# Patient Record
Sex: Female | Born: 1955 | Race: Black or African American | Hispanic: No | Marital: Married | Smoking: Never smoker
Health system: Southern US, Community
[De-identification: ages and names within clinical notes are randomized; demographics above are authoritative.]

## PROBLEM LIST (undated history)

## (undated) DIAGNOSIS — Z87442 Personal history of urinary calculi: Secondary | ICD-10-CM

## (undated) DIAGNOSIS — H00029 Hordeolum internum unspecified eye, unspecified eyelid: Secondary | ICD-10-CM

## (undated) DIAGNOSIS — B029 Zoster without complications: Secondary | ICD-10-CM

## (undated) DIAGNOSIS — M5126 Other intervertebral disc displacement, lumbar region: Secondary | ICD-10-CM

## (undated) DIAGNOSIS — I10 Essential (primary) hypertension: Secondary | ICD-10-CM

## (undated) DIAGNOSIS — R200 Anesthesia of skin: Secondary | ICD-10-CM

## (undated) DIAGNOSIS — Z973 Presence of spectacles and contact lenses: Secondary | ICD-10-CM

## (undated) DIAGNOSIS — R51 Headache: Secondary | ICD-10-CM

## (undated) DIAGNOSIS — T7840XA Allergy, unspecified, initial encounter: Secondary | ICD-10-CM

## (undated) DIAGNOSIS — K432 Incisional hernia without obstruction or gangrene: Secondary | ICD-10-CM

## (undated) HISTORY — PX: OTHER SURGICAL HISTORY: SHX169

## (undated) HISTORY — PX: CYSTOSCOPY/URETEROSCOPY/HOLMIUM LASER/STENT PLACEMENT: SHX6546

## (undated) HISTORY — PX: LITHOTRIPSY: SUR834

## (undated) HISTORY — PX: ABDOMINAL HYSTERECTOMY: SHX81

## (undated) HISTORY — DX: Allergy, unspecified, initial encounter: T78.40XA

---

## 1999-04-20 ENCOUNTER — Other Ambulatory Visit: Admission: RE | Admit: 1999-04-20 | Discharge: 1999-04-20 | Payer: Self-pay | Admitting: Obstetrics and Gynecology

## 2000-04-22 ENCOUNTER — Encounter: Payer: Self-pay | Admitting: Obstetrics and Gynecology

## 2000-04-22 ENCOUNTER — Encounter: Admission: RE | Admit: 2000-04-22 | Discharge: 2000-04-22 | Payer: Self-pay | Admitting: Obstetrics and Gynecology

## 2002-03-08 ENCOUNTER — Encounter: Payer: Self-pay | Admitting: Obstetrics and Gynecology

## 2002-03-08 ENCOUNTER — Encounter: Admission: RE | Admit: 2002-03-08 | Discharge: 2002-03-08 | Payer: Self-pay | Admitting: Obstetrics and Gynecology

## 2003-05-16 ENCOUNTER — Encounter: Admission: RE | Admit: 2003-05-16 | Discharge: 2003-05-16 | Payer: Self-pay | Admitting: Obstetrics and Gynecology

## 2003-05-16 ENCOUNTER — Encounter: Payer: Self-pay | Admitting: Obstetrics and Gynecology

## 2004-05-14 ENCOUNTER — Other Ambulatory Visit: Admission: RE | Admit: 2004-05-14 | Discharge: 2004-05-14 | Payer: Self-pay | Admitting: Gynecology

## 2004-09-04 ENCOUNTER — Encounter: Admission: RE | Admit: 2004-09-04 | Discharge: 2004-09-04 | Payer: Self-pay | Admitting: Gynecology

## 2005-07-22 ENCOUNTER — Other Ambulatory Visit: Admission: RE | Admit: 2005-07-22 | Discharge: 2005-07-22 | Payer: Self-pay | Admitting: Gynecology

## 2005-12-27 ENCOUNTER — Encounter: Admission: RE | Admit: 2005-12-27 | Discharge: 2005-12-27 | Payer: Self-pay | Admitting: Gynecology

## 2006-05-23 ENCOUNTER — Ambulatory Visit: Payer: Self-pay | Admitting: Gastroenterology

## 2006-05-30 ENCOUNTER — Ambulatory Visit (HOSPITAL_BASED_OUTPATIENT_CLINIC_OR_DEPARTMENT_OTHER): Admission: RE | Admit: 2006-05-30 | Discharge: 2006-05-30 | Payer: Self-pay | Admitting: Orthopedic Surgery

## 2006-06-02 ENCOUNTER — Ambulatory Visit: Payer: Self-pay | Admitting: Gastroenterology

## 2006-11-01 HISTORY — PX: OTHER SURGICAL HISTORY: SHX169

## 2006-12-14 ENCOUNTER — Other Ambulatory Visit: Admission: RE | Admit: 2006-12-14 | Discharge: 2006-12-14 | Payer: Self-pay | Admitting: Gynecology

## 2007-01-26 ENCOUNTER — Encounter: Admission: RE | Admit: 2007-01-26 | Discharge: 2007-01-26 | Payer: Self-pay | Admitting: Gynecology

## 2008-02-02 ENCOUNTER — Encounter: Admission: RE | Admit: 2008-02-02 | Discharge: 2008-02-02 | Payer: Self-pay | Admitting: Gynecology

## 2008-11-25 ENCOUNTER — Encounter: Admission: RE | Admit: 2008-11-25 | Discharge: 2009-02-23 | Payer: Self-pay | Admitting: Gynecology

## 2009-02-03 ENCOUNTER — Encounter: Admission: RE | Admit: 2009-02-03 | Discharge: 2009-02-03 | Payer: Self-pay | Admitting: Gynecology

## 2009-07-31 ENCOUNTER — Encounter: Admission: RE | Admit: 2009-07-31 | Discharge: 2009-07-31 | Payer: Self-pay | Admitting: Family Medicine

## 2009-08-01 ENCOUNTER — Encounter: Admission: RE | Admit: 2009-08-01 | Discharge: 2009-08-01 | Payer: Self-pay | Admitting: Family Medicine

## 2009-08-11 ENCOUNTER — Ambulatory Visit (HOSPITAL_BASED_OUTPATIENT_CLINIC_OR_DEPARTMENT_OTHER): Admission: RE | Admit: 2009-08-11 | Discharge: 2009-08-11 | Payer: Self-pay | Admitting: Urology

## 2010-02-23 ENCOUNTER — Encounter: Admission: RE | Admit: 2010-02-23 | Discharge: 2010-02-23 | Payer: Self-pay | Admitting: Gynecology

## 2010-09-21 ENCOUNTER — Ambulatory Visit (HOSPITAL_COMMUNITY): Admission: RE | Admit: 2010-09-21 | Discharge: 2010-09-21 | Payer: Self-pay | Admitting: Urology

## 2010-09-22 ENCOUNTER — Ambulatory Visit (HOSPITAL_COMMUNITY)
Admission: RE | Admit: 2010-09-22 | Discharge: 2010-09-22 | Payer: Self-pay | Source: Home / Self Care | Admitting: Surgery

## 2010-10-06 ENCOUNTER — Ambulatory Visit (HOSPITAL_COMMUNITY)
Admission: RE | Admit: 2010-10-06 | Discharge: 2010-10-06 | Payer: Self-pay | Source: Home / Self Care | Admitting: Surgery

## 2010-11-01 HISTORY — PX: LITHOTRIPSY: SUR834

## 2010-12-17 ENCOUNTER — Ambulatory Visit (HOSPITAL_COMMUNITY)
Admission: RE | Admit: 2010-12-17 | Discharge: 2010-12-17 | Disposition: A | Discharge status: Home / Self Care | Payer: 59 | Source: Ambulatory Visit | Attending: Urology | Admitting: Urology

## 2010-12-17 ENCOUNTER — Ambulatory Visit (HOSPITAL_COMMUNITY)
Admission: RE | Admit: 2010-12-17 | Discharge: 2010-12-17 | Disposition: A | Discharge status: Home / Self Care | Payer: 59 | Attending: Urology | Admitting: Urology

## 2010-12-17 DIAGNOSIS — N200 Calculus of kidney: Secondary | ICD-10-CM | POA: Insufficient documentation

## 2010-12-17 DIAGNOSIS — R32 Unspecified urinary incontinence: Secondary | ICD-10-CM | POA: Insufficient documentation

## 2010-12-17 DIAGNOSIS — Z9079 Acquired absence of other genital organ(s): Secondary | ICD-10-CM | POA: Insufficient documentation

## 2010-12-17 DIAGNOSIS — Z79890 Hormone replacement therapy (postmenopausal): Secondary | ICD-10-CM | POA: Insufficient documentation

## 2010-12-17 DIAGNOSIS — Z880 Allergy status to penicillin: Secondary | ICD-10-CM | POA: Insufficient documentation

## 2010-12-17 DIAGNOSIS — N201 Calculus of ureter: Secondary | ICD-10-CM | POA: Insufficient documentation

## 2010-12-17 DIAGNOSIS — N281 Cyst of kidney, acquired: Secondary | ICD-10-CM | POA: Insufficient documentation

## 2010-12-17 DIAGNOSIS — Z79899 Other long term (current) drug therapy: Secondary | ICD-10-CM | POA: Insufficient documentation

## 2011-01-12 LAB — CBC
HCT: 39.5 % (ref 36.0–46.0)
MCH: 27.2 pg (ref 26.0–34.0)
MCHC: 32.7 g/dL (ref 30.0–36.0)
MCV: 83.2 fL (ref 78.0–100.0)
RBC: 4.75 MIL/uL (ref 3.87–5.11)
WBC: 4.4 10*3/uL (ref 4.0–10.5)

## 2011-01-12 LAB — URINALYSIS, ROUTINE W REFLEX MICROSCOPIC
Nitrite: NEGATIVE
Specific Gravity, Urine: 1.02 (ref 1.005–1.030)
pH: 7 (ref 5.0–8.0)

## 2011-01-12 LAB — BASIC METABOLIC PANEL
BUN: 10 mg/dL (ref 6–23)
Calcium: 10.8 mg/dL — ABNORMAL HIGH (ref 8.4–10.5)
Potassium: 3.8 mEq/L (ref 3.5–5.1)
Sodium: 138 mEq/L (ref 135–145)

## 2011-01-12 LAB — URINE MICROSCOPIC-ADD ON

## 2011-01-12 LAB — DIFFERENTIAL
Basophils Absolute: 0 10*3/uL (ref 0.0–0.1)
Eosinophils Absolute: 0.2 10*3/uL (ref 0.0–0.7)
Eosinophils Relative: 4 % (ref 0–5)
Lymphocytes Relative: 42 % (ref 12–46)
Monocytes Absolute: 0.3 10*3/uL (ref 0.1–1.0)

## 2011-01-12 LAB — SURGICAL PCR SCREEN: MRSA, PCR: NEGATIVE

## 2011-01-12 LAB — PROTIME-INR: INR: 0.94 (ref 0.00–1.49)

## 2011-01-25 ENCOUNTER — Other Ambulatory Visit: Payer: Self-pay | Admitting: Gynecology

## 2011-01-25 DIAGNOSIS — Z1231 Encounter for screening mammogram for malignant neoplasm of breast: Secondary | ICD-10-CM

## 2011-03-15 ENCOUNTER — Ambulatory Visit
Admission: RE | Admit: 2011-03-15 | Discharge: 2011-03-15 | Disposition: A | Discharge status: Home / Self Care | Payer: 59 | Source: Ambulatory Visit | Attending: Gynecology | Admitting: Gynecology

## 2011-03-15 DIAGNOSIS — Z1231 Encounter for screening mammogram for malignant neoplasm of breast: Secondary | ICD-10-CM

## 2011-03-19 NOTE — Op Note (Signed)
[name], Janice Guerra                ACCOUNT NO.:  [id number]   MEDICAL RECORD NO.:  [id number]          PATIENT TYPE:  AMB   LOCATION:  DSC                          FACILITY:  [facility]   PHYSICIAN:  Feliberto Gottron. Turner Daniels, M.D.   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   PROCEDURE:  Right hand trigger thumb release and right de Quervain's  stenosing tenosynovitis release.   SURGEON:  Feliberto Gottron. Turner Daniels, M.D.   FIRST ASSISTANT:  Skip Mayer, PA-C   ANESTHETIC:  General LMA.   ESTIMATED BLOOD LOSS:  Minimal.   TOURNIQUET TIME:  30 minutes.   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  She desires elective trigger thumb release and de  Quervain's release to decrease pain and decrease function.  Risks and  benefits of surgery were discussed preoperatively and she was prepared for  surgical intervention.   DESCRIPTION OF PROCEDURE:  The patient identified by arm band, taken to the  operating room at [facility] where the appropriate monitors  were attached and general LMA anesthesia induced with the patient in the  supine position.  Tourniquet was applied to the right forearm and the right  hand prepped and draped in the usual sterile fashion from the fingertips to  the tourniquet, the limb wrapped with an Esmarch bandage, inflated to 250  mmHg.  We began the procedure by making a 1.5-cm longitudinal incision over  the abductor pollicus longus and extensor pollicus brevis as it went  through  the A1 pulley which was quite enlarged.  Small bleeders were identified and  cauterized.  We immediately encountered the pulley.  We were careful to  spare any significant branches of the superficial radial nerve and a  rectangular section of the pulley was removed and all four the tendons and  tendon sheaths were identified and we made sure their release was thorough.  The wound was irrigated out with normal saline solution.  We then directed  our attention to the thumb and made a transverse incision over the A1  pulley, about 1 cm to 1.5 cm in length, identified both neurovascular  bundles which were protected, and then likewise removed a rectangular  section of the A1 pulley which pretty much eliminated all the triggering.  That wound was also irrigated out with normal saline solution and then both  wounds were closed with running 4-0 nylon  suture.  A dressing of Xeroform, 4 x 4 dressing, sponge, Webril, and an Ace  wrap applied after first infiltrating the skin with 0.5% Marcaine solution  without epinephrine.  The patient was then awakened and taken to the  recovery room without  difficulty.      Feliberto Gottron. Turner Daniels, M.D.  Electronically Signed     FJR/MEDQ  D:  05/30/2006  T:  05/30/2006  Job:  [id number]

## 2011-03-31 ENCOUNTER — Other Ambulatory Visit: Payer: Self-pay | Admitting: Gynecology

## 2011-06-21 ENCOUNTER — Encounter: Payer: Self-pay | Admitting: Gastroenterology

## 2011-09-10 ENCOUNTER — Other Ambulatory Visit: Payer: Self-pay | Admitting: Urology

## 2011-09-15 ENCOUNTER — Encounter (HOSPITAL_COMMUNITY): Payer: Self-pay | Admitting: *Deleted

## 2011-09-15 NOTE — H&P (Signed)
Active Problems Problems  1. Diffuse Abdominal Pain 789.07 right flank pain 2. Gross Hematuria 599.71 3. Nephrolithiasis Bilateral 592.0 4. Proximal Ureteral Stone On The Right 592.1 5. Pyuria 791.9 6. Renal Cyst 593.2 7. Urinary Incontinence 788.30  History of Present Illness        Janice Guerra was in about 2 weeks ago because of hematuria.  She had a single episode of right abdominal pain.  A CTU showed a 6mm right ureteral stone and bilateral renal stones.   She has not seen the stone pass but still has bleeding.   Past Medical History Problems  1. History of  Hydronephrosis Right 591 2. History of  Hyperparathyroidism 252.00 3. History of  Nephrolithiasis V13.01 4. History of  Ureteral Stone Right 592.1  Surgical History Problems  1. History of  Cystoscopy With Insertion Of Ureteral Stent Right 2. History of  Cystoscopy With Ureteroscopy Right 3. History of  Hysterectomy V45.77 4. History of  Lithotripsy 5. History of  Parathyroid Surgery  Current Meds 1. Lotemax 0.5 % Ophthalmic Suspension; Therapy: 14Oct2011 to 2. Oxycodone-Acetaminophen 5-325 MG Oral Tablet; TAKE 1 TO 2 TABLETS EVERY 4 TO 6 HOURS  AS NEEDED FOR PAIN; Therapy: 23Oct2012 to (Evaluate:26Oct2012); Last Rx:23Oct2012 3. Restasis 0.05 % Ophthalmic Emulsion; Therapy: 29Oct2011 to 4. Tamsulosin HCl 0.4 MG Oral Capsule; TAKE ONE CAPSULE BY MOUTH EVERY DAY WITH  MEALS; Therapy: 17Feb2012 to (Evaluate:17Apr2012); Last Rx:17Feb2012 5. Vivelle-Dot 0.05 MG/24HR Transdermal Patch Biweekly; Therapy: 31Jul2011 to  Allergies Medication  1. Penicillins  Family History Problems  1. Paternal history of  Heart Disease V17.49 2. Sororal history of  Nephrolithiasis 3. Paternal history of  Nephrolithiasis 4. Maternal history of  Nephrolithiasis  Social History Problems  1. Caffeine Use 1-2 cups 2. Former Smoker 3. Marital History - Currently Married 4. Occupation: HR manager  Review of Systems  Genitourinary:  hematuria.  Gastrointestinal: no nausea and no abdominal pain.  Constitutional: no fever.    Vitals Vital Signs [Data Includes: Last 1 Day]  07Nov2012 03:57PM  Blood Pressure: 132 / 83 Temperature: 98.6 F Heart Rate: 75  Physical Exam Constitutional: Well nourished and well developed . No acute distress.  Pulmonary: No respiratory distress and normal respiratory rhythm and effort.  Cardiovascular: Heart rate and rhythm are normal . No peripheral edema.    Results/Data Urine [Data Includes: Last 1 Day]  07Nov2012  COLOR: YELLOW  Reference Range YELLOW APPEARANCE: CLEAR  Reference Range CLEAR SPECIFIC GRAVITY: <1.005  Abnormal Low Reference Range 1.005-1.030 pH: 6.5  Reference Range 5.0-8.0 GLUCOSE: NEG mg/dL Reference Range NEG BILIRUBIN: NEG  Reference Range NEG KETONE: NEG mg/dL Reference Range NEG BLOOD: LARGE  Abnormal Reference Range NEG PROTEIN: NEG mg/dL Reference Range NEG UROBILINOGEN: 0.2 mg/dL Reference Range 4.0-9.8 NITRITE: NEG  Reference Range NEG LEUKOCYTE ESTERASE: NEG  Reference Range NEG SQUAMOUS EPITHELIAL/HPF: RARE  Reference Range RARE WBC: 0-3 WBC/hpf Reference Range <4 RBC: 11-20 RBC/hpf Abnormal Reference Range <4 BACTERIA: RARE  Reference Range RARE CRYSTALS: NONE SEEN  Reference Range NEG CASTS: NONE SEEN  Reference Range NEG  The following images/tracing/specimen were independently visualized:  KUB today shows a stable 5-2mm LLP stone and small right lower pole stones with a 6 mm stone at the right UPJ. there is a pill shadow over the RLP. The study is otherwise unremarkable.    Assessment Assessed  1. Proximal Ureteral Stone On The Right 592.1 2. Nephrolithiasis Bilateral 592.0   Her right proximal stone has not moved.  She has stable  bilateral lower pole stones.   Plan Health Maintenance (V70.0)  1. UA With REFLEX  Done: 07Nov2012 03:39PM Proximal Ureteral Stone On The Right (592.1)  2. KUB  Done: 07Nov2012 12:00AM   I discussed the  options for treatment including ureteroscopy and ESWL.  She is aware of the risks based on her prior lithotripsies.   I will try to get her set up in the next 1-3 weeks.   Discussion/Summary  CC: Dr. Darnell Level.

## 2011-09-15 NOTE — Pre-Procedure Instructions (Signed)
Pt instructed to take laxative this afternoon and eat a light supper and. Call [facility] to get blue folder

## 2011-09-16 ENCOUNTER — Ambulatory Visit (HOSPITAL_COMMUNITY): Payer: 59

## 2011-09-16 ENCOUNTER — Encounter (HOSPITAL_COMMUNITY)
Admission: RE | Disposition: A | Discharge status: Home / Self Care | Payer: Self-pay | Source: Ambulatory Visit | Attending: Urology

## 2011-09-16 ENCOUNTER — Ambulatory Visit (HOSPITAL_COMMUNITY)
Admission: RE | Admit: 2011-09-16 | Discharge: 2011-09-16 | Disposition: A | Discharge status: Home / Self Care | Payer: 59 | Source: Ambulatory Visit | Attending: Urology | Admitting: Urology

## 2011-09-16 ENCOUNTER — Encounter (HOSPITAL_COMMUNITY): Payer: Self-pay | Admitting: *Deleted

## 2011-09-16 DIAGNOSIS — N200 Calculus of kidney: Secondary | ICD-10-CM | POA: Insufficient documentation

## 2011-09-16 DIAGNOSIS — N201 Calculus of ureter: Secondary | ICD-10-CM | POA: Diagnosis present

## 2011-09-16 HISTORY — DX: Headache: R51

## 2011-09-16 SURGERY — LITHOTRIPSY, ESWL
Anesthesia: LOCAL | Laterality: Right

## 2011-09-16 MED ORDER — DIAZEPAM 5 MG PO TABS
ORAL_TABLET | ORAL | Status: AC
  Filled 2011-09-16: qty 2

## 2011-09-16 MED ORDER — CIPROFLOXACIN IN D5W 400 MG/200ML IV SOLN
400.0000 mg | Freq: Once | INTRAVENOUS | Status: AC
  Administered 2011-09-16: 400 mg via INTRAVENOUS
  Filled 2011-09-16: qty 200

## 2011-09-16 MED ORDER — DEXTROSE-NACL 5-0.45 % IV SOLN
Freq: Once | INTRAVENOUS | Status: AC
  Administered 2011-09-16: 16:00:00 via INTRAVENOUS

## 2011-09-16 MED ORDER — DIPHENHYDRAMINE HCL 25 MG PO CAPS
ORAL_CAPSULE | ORAL | Status: AC
  Filled 2011-09-16: qty 1

## 2011-09-16 MED ORDER — ONDANSETRON HCL 4 MG/2ML IJ SOLN
4.0000 mg | Freq: Once | INTRAMUSCULAR | Status: AC
  Administered 2011-09-16: 4 mg via INTRAVENOUS

## 2011-09-16 MED ORDER — CIPROFLOXACIN IN D5W 200 MG/100ML IV SOLN: 200.0000 mg | Freq: Once | INTRAVENOUS | Status: DC

## 2011-09-16 MED ORDER — DIPHENHYDRAMINE HCL 25 MG PO CAPS
25.0000 mg | ORAL_CAPSULE | ORAL | Status: AC
  Administered 2011-09-16: 25 mg via ORAL

## 2011-09-16 MED ORDER — ONDANSETRON HCL 4 MG/2ML IJ SOLN
INTRAMUSCULAR | Status: AC
  Filled 2011-09-16: qty 2

## 2011-09-16 MED ORDER — DIAZEPAM 5 MG PO TABS
10.0000 mg | ORAL_TABLET | ORAL | Status: AC
  Administered 2011-09-16: 10 mg via ORAL

## 2011-09-16 NOTE — Interval H&P Note (Signed)
History and Physical Interval Note:   09/16/2011   7:34 AM   Janice Guerra  has presented today for surgery, with the diagnosis of Right Proximal Stone  The various methods of treatment have been discussed with the patient and family. After consideration of risks, benefits and other options for treatment, the patient has consented to  Procedure(s): EXTRACORPOREAL SHOCK WAVE LITHOTRIPSY (ESWL) as a surgical intervention .  The patients' history has been reviewed, patient examined, no change in status, stable for surgery.  I have reviewed the patients' chart and labs.  Questions were answered to the patient's satisfaction.     Anner Crete  MD

## 2011-09-16 NOTE — Brief Op Note (Signed)
09/16/2011  5:25 PM  PATIENT:  Janice Guerra  55 y.o. female  PRE-OPERATIVE DIAGNOSIS:  Right Proximal Stone  POST-OPERATIVE DIAGNOSIS:  Right Proximal Stone.  PROCEDURE:  Procedure(s): EXTRACORPOREAL SHOCK WAVE LITHOTRIPSY (ESWL)  SURGEON:  Surgeon(s): Anner Crete  PHYSICIAN ASSISTANT:   ASSISTANTS: none   ANESTHESIA:   IV sedation  EBL:     BLOOD ADMINISTERED:none  DRAINS: none   LOCAL MEDICATIONS USED:  NONE  SPECIMEN:  No Specimen  DISPOSITION OF SPECIMEN:  N/A  COUNTS:  YES  TOURNIQUET:  * No tourniquets in log *  DICTATION: .Note written in paper chart  PLAN OF CARE: Discharge to home after PACU  PATIENT DISPOSITION:  PACU - hemodynamically stable.   Delay start of Pharmacological VTE agent (>24hrs) due to surgical blood loss or risk of bleeding:  {YES/NO/NOT APPLICABLE:20182

## 2011-09-16 NOTE — Progress Notes (Signed)
Pt denies any aspirin ibuprofen and toradol in last 72 hours  Took Laxative ask directed with good results

## 2011-09-16 NOTE — Interval H&P Note (Signed)
History and Physical Interval Note:   09/16/2011   5:24 PM   Janice Guerra  has presented today for surgery, with the diagnosis of Right Proximal Stone  The various methods of treatment have been discussed with the patient and family. After consideration of risks, benefits and other options for treatment, the patient has consented to  Procedure(s): EXTRACORPOREAL SHOCK WAVE LITHOTRIPSY (ESWL) as a surgical intervention .  The patients' history has been reviewed, patient examined, no change in status, stable for surgery.  I have reviewed the patients' chart and labs.  Questions were answered to the patient's satisfaction.     Anner Crete  MD

## 2011-10-17 ENCOUNTER — Emergency Department (HOSPITAL_COMMUNITY)
Admission: EM | Admit: 2011-10-17 | Discharge: 2011-10-17 | Disposition: A | Discharge status: Home / Self Care | Payer: 59 | Attending: Emergency Medicine | Admitting: Emergency Medicine

## 2011-10-17 ENCOUNTER — Encounter (HOSPITAL_COMMUNITY): Payer: Self-pay | Admitting: Emergency Medicine

## 2011-10-17 DIAGNOSIS — M5430 Sciatica, unspecified side: Secondary | ICD-10-CM | POA: Insufficient documentation

## 2011-10-17 DIAGNOSIS — M5380 Other specified dorsopathies, site unspecified: Secondary | ICD-10-CM | POA: Insufficient documentation

## 2011-10-17 DIAGNOSIS — Z87442 Personal history of urinary calculi: Secondary | ICD-10-CM | POA: Insufficient documentation

## 2011-10-17 LAB — POCT I-STAT, CHEM 8
Calcium, Ion: 1.13 mmol/L (ref 1.12–1.32)
Chloride: 103 mEq/L (ref 96–112)
HCT: 43 % (ref 36.0–46.0)

## 2011-10-17 LAB — URINALYSIS, ROUTINE W REFLEX MICROSCOPIC
Ketones, ur: 15 mg/dL — AB
Leukocytes, UA: NEGATIVE
Nitrite: NEGATIVE
Specific Gravity, Urine: 1.018 (ref 1.005–1.030)
pH: 8 (ref 5.0–8.0)

## 2011-10-17 MED ORDER — HYDROMORPHONE HCL PF 1 MG/ML IJ SOLN
1.0000 mg | Freq: Once | INTRAMUSCULAR | Status: AC
  Administered 2011-10-17: 1 mg via INTRAVENOUS
  Filled 2011-10-17: qty 1

## 2011-10-17 MED ORDER — HYDROMORPHONE HCL 2 MG PO TABS: 2.0000 mg | ORAL_TABLET | Freq: Four times a day (QID) | ORAL | Status: AC | PRN

## 2011-10-17 MED ORDER — IBUPROFEN 400 MG PO TABS: 600.0000 mg | ORAL_TABLET | Freq: Three times a day (TID) | ORAL | Status: AC | PRN

## 2011-10-17 NOTE — ED Notes (Signed)
HX of lithotripsey Nov 15, 1 f/u visit w/ another scheduled 12/27.  Intermittent pain past 2 weeks w/ escalation yesterday with pain in left hip, radiation into left thigh. Had steriod injection yesterday at physicians office. Difficulty w/ initiation of urination, denies other UTI Sx

## 2011-10-17 NOTE — ED Provider Notes (Signed)
History     CSN: [id number] Arrival date & time: 10/17/2011  2:24 PM   First MD Initiated Contact with Patient 10/17/11 1548      Chief Complaint  Patient presents with  . Back Pain  (Consider location/radiation/quality/duration/timing/severity/associated sxs/prior treatment) HPI Patient presents emergency room with complaints of back pain. Patient states it started over about the last week or 2. The pain is in her left lower back. It radiates towards her left hip and left thigh. Patient states the pain is severe. It is constant and increases with certain movements and positions. It is not completely relieved with rest. The patient states she also feels a sensation of numbness in her upper left thigh. She has not had any trouble with incontinence. She has not any trouble with weakness and movement. Patient did see a urgent care doctor yesterday and was given a shot of steroids and started on oral steroids. She already  had a prescription for Percocet so she has been taking some that but it has not helped. Patient does have a sensation of bladder fullness. Past Medical History  Diagnosis Date  . Chronic kidney disease     kidney stones  . Headache     due to shingles 3 yr ago  . Kidney stones     lithotripsey 11/15    Past Surgical History  Procedure Date  . Lower rt parathyroid gland removed 20012  . Abdominal hysterectomy   . Rt hand trigger finger 2008    No family history on file.  History  Substance Use Topics  . Smoking status: Never Smoker   . Smokeless tobacco: Not on file  . Alcohol Use: No    OB History    Grav Para Term Preterm Abortions TAB SAB Ect Mult Living                  Review of Systems  Gastrointestinal: Negative for vomiting, abdominal pain and constipation.  Neurological: Positive for numbness. Negative for speech difficulty and weakness.  All other systems reviewed and are negative.    Allergies  Penicillins  Home Medications   Current  Outpatient Rx  Name Route Sig Dispense Refill  . ASPIRIN EC 81 MG PO TBEC Oral Take 81 mg by mouth daily.      Marland Kitchen BIOTIN 1000 MCG PO TABS Oral Take 1,000 mcg by mouth 3 (three) times daily.      . CYCLOSPORINE 0.05 % OP EMUL Both Eyes Place 1 drop into both eyes daily.      . TYLENOL PM EXTRA STRENGTH PO Oral Take 2 tablets by mouth daily as needed.      Marland Kitchen ESTRADIOL 0.05 MG/24HR TD PTTW Transdermal Place 1 patch onto the skin 2 (two) times a week.      Marland Kitchen FLAXSEED OIL 1000 MG PO CAPS Oral Take 1 capsule by mouth daily.      Marland Kitchen GLUCOSAMINE CHOND COMPLEX/MSM PO Oral Take 1 tablet by mouth daily.      Carma Leaven M PLUS PO TABS Oral Take 1 tablet by mouth daily.      Marland Kitchen PRESCRIPTION MEDICATION Both Eyes Place 1 drop into both eyes daily as needed. Lomax Eye Drops. For redness.     Marland Kitchen SILODOSIN 8 MG PO CAPS Oral Take 8 mg by mouth at bedtime.      Marland Kitchen VITAMIN B-12 1000 MCG PO TABS Oral Take 1,000 mcg by mouth daily.        BP 132/90  Pulse  66  Temp(Src) 99.3 F (37.4 C) (Oral)  Resp 14  SpO2 100%  Physical Exam  Nursing note and vitals reviewed. Constitutional: She appears well-developed and well-nourished.  HENT:  Head: Normocephalic and atraumatic.  Right Ear: External ear normal.  Left Ear: External ear normal.  Nose: Nose normal.  Eyes: Conjunctivae and EOM are normal.  Neck: Neck supple. No tracheal deviation present.  Pulmonary/Chest: Effort normal. No stridor. No respiratory distress.  Musculoskeletal: She exhibits no edema and no tenderness.       Lumbar back: She exhibits decreased range of motion, tenderness, pain and spasm. She exhibits no swelling and no edema.  Neurological: She is alert. She is not disoriented. No cranial nerve deficit or sensory deficit. She exhibits normal muscle tone. Coordination normal.  Reflex Scores:      Patellar reflexes are 2+ on the right side and 2+ on the left side.      Achilles reflexes are 2+ on the right side and 2+ on the left side.      The  patient does have pain with elevation of her left leg, subjective altered sensation of left upper thigh  Skin: Skin is warm and dry. No rash noted. She is not diaphoretic. No erythema.  Psychiatric: She has a normal mood and affect. Her behavior is normal. Thought content normal.    ED Course  Procedures (including critical care time) Bladder scan was performed. Patient less than 30 cc of urine after urinating. Not appear in any evidence of urinary retention Labs Reviewed  URINALYSIS, ROUTINE W REFLEX MICROSCOPIC - Abnormal; Notable for the following:    APPearance CLOUDY (*)    Ketones, ur 15 (*)    All other components within normal limits  POCT I-STAT, CHEM 8 - Abnormal; Notable for the following:    Potassium 3.3 (*)    Glucose, Bld 107 (*)    All other components within normal limits  I-STAT, CHEM 8   No results found.   Diagnosis: Sciatica   MDM  No sign of acute neurological or vascular emergency associated with pt's back pain.  May have a component of sciatica.  Safe for outpatient follow up.  Doubt renal colic. Urine does not suggest UTI. There is no evidence of renal insufficiency.        Celene Kras, MD 10/17/11 [phone]

## 2011-10-17 NOTE — ED Notes (Signed)
(  Dr. Lynelle Doctor) MD at bedside.

## 2011-10-17 NOTE — ED Notes (Signed)
Pt had 150cc urine output (clear yellow). Post void residual 31ml per bladder scanner

## 2011-10-17 NOTE — ED Notes (Signed)
Unable to sign everification. Pt verbalized understanding of d/c instructions

## 2011-10-28 ENCOUNTER — Other Ambulatory Visit: Payer: Self-pay | Admitting: Family Medicine

## 2011-10-28 ENCOUNTER — Ambulatory Visit
Admission: RE | Admit: 2011-10-28 | Discharge: 2011-10-28 | Disposition: A | Discharge status: Home / Self Care | Payer: 59 | Source: Ambulatory Visit | Attending: Family Medicine | Admitting: Family Medicine

## 2011-10-28 DIAGNOSIS — M545 Low back pain, unspecified: Secondary | ICD-10-CM

## 2012-01-12 ENCOUNTER — Encounter: Payer: Self-pay | Admitting: Gastroenterology

## 2012-02-09 ENCOUNTER — Ambulatory Visit (AMBULATORY_SURGERY_CENTER): Payer: 59 | Admitting: *Deleted

## 2012-02-09 VITALS — Ht 67.0 in | Wt 207.5 lb

## 2012-02-09 DIAGNOSIS — Z1211 Encounter for screening for malignant neoplasm of colon: Secondary | ICD-10-CM

## 2012-02-09 MED ORDER — PEG-KCL-NACL-NASULF-NA ASC-C 100 G PO SOLR: ORAL | Status: DC

## 2012-02-10 ENCOUNTER — Encounter: Payer: Self-pay | Admitting: Gastroenterology

## 2012-02-22 ENCOUNTER — Encounter: Payer: Self-pay | Admitting: Gastroenterology

## 2012-02-22 ENCOUNTER — Ambulatory Visit (AMBULATORY_SURGERY_CENTER): Payer: 59 | Admitting: Gastroenterology

## 2012-02-22 VITALS — BP 119/69 | HR 65 | Temp 97.8°F | Resp 18 | Ht 67.0 in | Wt 207.0 lb

## 2012-02-22 DIAGNOSIS — Z800 Family history of malignant neoplasm of digestive organs: Secondary | ICD-10-CM

## 2012-02-22 DIAGNOSIS — Z1211 Encounter for screening for malignant neoplasm of colon: Secondary | ICD-10-CM

## 2012-02-22 MED ORDER — SODIUM CHLORIDE 0.9 % IV SOLN: 500.0000 mL | INTRAVENOUS | Status: DC

## 2012-02-22 NOTE — Progress Notes (Signed)
No complaints noted in the recovery room. Maw   

## 2012-02-22 NOTE — Progress Notes (Signed)
PROPOFOL PER S CAMP CRNA, MEDS TITRATED PER MD AND CRNA. SEE SCANNED INTRA PROCEDURE REPORT. EWM

## 2012-02-22 NOTE — Patient Instructions (Signed)
You may resume your prior medications today.  Please call if any questions or concerns.    YOU HAD AN ENDOSCOPIC PROCEDURE TODAY AT THE [facility]: Refer to the procedure report that was given to you for any specific questions about what was found during the examination.  If the procedure report does not answer your questions, please call your gastroenterologist to clarify.  If you requested that your care partner not be given the details of your procedure findings, then the procedure report has been included in a sealed envelope for you to review at your convenience later.  YOU SHOULD EXPECT: Some feelings of bloating in the abdomen. Passage of more gas than usual.  Walking can help get rid of the air that was put into your GI tract during the procedure and reduce the bloating. If you had a lower endoscopy (such as a colonoscopy or flexible sigmoidoscopy) you may notice spotting of blood in your stool or on the toilet paper. If you underwent a bowel prep for your procedure, then you may not have a normal bowel movement for a few days.  DIET: Your first meal following the procedure should be a light meal and then it is ok to progress to your normal diet.  A half-sandwich or bowl of soup is an example of a good first meal.  Heavy or fried foods are harder to digest and may make you feel nauseous or bloated.  Likewise meals heavy in dairy and vegetables can cause extra gas to form and this can also increase the bloating.  Drink plenty of fluids but you should avoid alcoholic beverages for 24 hours.  ACTIVITY: Your care partner should take you home directly after the procedure.  You should plan to take it easy, moving slowly for the rest of the day.  You can resume normal activity the day after the procedure however you should NOT DRIVE or use heavy machinery for 24 hours (because of the sedation medicines used during the test).    SYMPTOMS TO REPORT IMMEDIATELY: A gastroenterologist can be  reached at any hour.  During normal business hours, 8:30 AM to 5:00 PM Monday through Friday, call [phone].  After hours and on weekends, please call the GI answering service at [phone] who will take a message and have the physician on call contact you.   Following lower endoscopy (colonoscopy or flexible sigmoidoscopy):  Excessive amounts of blood in the stool  Significant tenderness or worsening of abdominal pains  Swelling of the abdomen that is new, acute  Fever of 100F or higher    FOLLOW UP: If any biopsies were taken you will be contacted by phone or by letter within the next 1-3 weeks.  Call your gastroenterologist if you have not heard about the biopsies in 3 weeks.  Our staff will call the home number listed on your records the next business day following your procedure to check on you and address any questions or concerns that you may have at that time regarding the information given to you following your procedure. This is a courtesy call and so if there is no answer at the home number and we have not heard from you through the emergency physician on call, we will assume that you have returned to your regular daily activities without incident.  SIGNATURES/CONFIDENTIALITY: You and/or your care partner have signed paperwork which will be entered into your electronic medical record.  These signatures attest to the fact that that the information   above on your After Visit Summary has been reviewed and is understood.  Full responsibility of the confidentiality of this discharge information lies with you and/or your care-partner.  

## 2012-02-22 NOTE — Op Note (Signed)
[facility] [address] [place], [place]  [address]  COLONOSCOPY PROCEDURE REPORT  PATIENT:  Janice Guerra, Janice Guerra  MR#:  [id number] BIRTHDATE:  10-11-1956, 55 yrs. old  GENDER:  female ENDOSCOPIST:  Vania Rea. Jarold Motto, MD, [facility] REF. BY: PROCEDURE DATE:  02/22/2012 PROCEDURE:  Colonoscopy 62130 ASA CLASS:  Class II INDICATIONS:  family history of colon cancer MEDICATIONS:   propofol (Diprivan) 200 mg IV  DESCRIPTION OF PROCEDURE:   After the risks and benefits and of the procedure were explained, informed consent was obtained. Digital rectal exam was performed and revealed no abnormalities. The LB [id number] endoscope was introduced through the anus and advanced to the cecum, which was identified by both the appendix and ileocecal valve.  The quality of the prep was excellent, using MoviPrep.  The instrument was then slowly withdrawn as the colon was fully examined. <<PROCEDUREIMAGES>>  FINDINGS:  No polyps or cancers were seen.  This was otherwise a normal examination of the colon.   Retroflexed views in the rectum revealed no abnormalities.    The scope was then withdrawn from the patient and the procedure completed.  COMPLICATIONS:  None ENDOSCOPIC IMPRESSION: 1) No polyps or cancers 2) Otherwise normal examination RECOMMENDATIONS: 1) Given your significant family history of colon cancer, you should have a repeat colonoscopy in 5 years  REPEAT EXAM:  No  ______________________________ Vania Rea. Jarold Motto, MD, [facility]  CC:  Elias Else, MD  n. Rosalie Doctor:   Vania Rea. Burgundy Matuszak at 02/22/2012 11:28 AM  Leeanne Deed, [phone]

## 2012-02-22 NOTE — Progress Notes (Signed)
Patient did not experience any of the following events: a burn prior to discharge; a fall within the facility; wrong site/side/patient/procedure/implant event; or a hospital transfer or hospital admission upon discharge from the facility. (G8907) Patient did not have preoperative order for IV antibiotic SSI prophylaxis. (G8918)  

## 2012-02-23 ENCOUNTER — Telehealth: Payer: Self-pay | Admitting: *Deleted

## 2012-02-23 NOTE — Telephone Encounter (Signed)
  Follow up Call-  Call back number 02/22/2012  Post procedure Call Back phone  # [phone], [phone]  Permission to leave phone message Yes     Patient questions:  Do you have a fever, pain , or abdominal swelling? no Pain Score  0 *  Have you tolerated food without any problems? yes  Have you been able to return to your normal activities? yes  Do you have any questions about your discharge instructions: Diet   no Medications  no Follow up visit  no  Do you have questions or concerns about your Care? no  Actions: * If pain score is 4 or above: No action needed, pain <4.

## 2012-02-28 ENCOUNTER — Other Ambulatory Visit: Payer: Self-pay | Admitting: Gynecology

## 2012-02-28 DIAGNOSIS — Z1231 Encounter for screening mammogram for malignant neoplasm of breast: Secondary | ICD-10-CM

## 2012-03-20 ENCOUNTER — Ambulatory Visit: Payer: 59

## 2012-03-24 ENCOUNTER — Other Ambulatory Visit: Payer: Self-pay | Admitting: Urology

## 2012-03-24 DIAGNOSIS — N281 Cyst of kidney, acquired: Secondary | ICD-10-CM

## 2012-04-03 ENCOUNTER — Other Ambulatory Visit: Payer: Self-pay | Admitting: Gynecology

## 2012-04-10 ENCOUNTER — Ambulatory Visit: Payer: 59

## 2012-04-17 ENCOUNTER — Ambulatory Visit: Payer: 59

## 2012-04-18 ENCOUNTER — Ambulatory Visit (HOSPITAL_COMMUNITY)
Admission: RE | Admit: 2012-04-18 | Discharge: 2012-04-18 | Disposition: A | Discharge status: Home / Self Care | Payer: 59 | Source: Ambulatory Visit | Attending: Urology | Admitting: Urology

## 2012-04-18 DIAGNOSIS — N281 Cyst of kidney, acquired: Secondary | ICD-10-CM | POA: Insufficient documentation

## 2012-04-18 DIAGNOSIS — N289 Disorder of kidney and ureter, unspecified: Secondary | ICD-10-CM | POA: Insufficient documentation

## 2012-04-18 MED ORDER — GADOBENATE DIMEGLUMINE 529 MG/ML IV SOLN
18.0000 mL | Freq: Once | INTRAVENOUS | Status: AC | PRN
  Administered 2012-04-18: 18 mL via INTRAVENOUS

## 2012-06-05 ENCOUNTER — Ambulatory Visit
Admission: RE | Admit: 2012-06-05 | Discharge: 2012-06-05 | Disposition: A | Discharge status: Home / Self Care | Payer: 59 | Source: Ambulatory Visit | Attending: Gynecology | Admitting: Gynecology

## 2012-06-05 DIAGNOSIS — Z1231 Encounter for screening mammogram for malignant neoplasm of breast: Secondary | ICD-10-CM

## 2012-06-07 ENCOUNTER — Other Ambulatory Visit: Payer: Self-pay | Admitting: Gynecology

## 2012-06-07 DIAGNOSIS — R928 Other abnormal and inconclusive findings on diagnostic imaging of breast: Secondary | ICD-10-CM

## 2012-06-20 ENCOUNTER — Other Ambulatory Visit: Payer: 59

## 2012-06-27 ENCOUNTER — Other Ambulatory Visit: Payer: 59

## 2012-07-06 ENCOUNTER — Ambulatory Visit
Admission: RE | Admit: 2012-07-06 | Discharge: 2012-07-06 | Disposition: A | Discharge status: Home / Self Care | Payer: 59 | Source: Ambulatory Visit | Attending: Gynecology | Admitting: Gynecology

## 2012-07-06 DIAGNOSIS — R928 Other abnormal and inconclusive findings on diagnostic imaging of breast: Secondary | ICD-10-CM

## 2012-11-29 ENCOUNTER — Ambulatory Visit
Admission: RE | Admit: 2012-11-29 | Discharge: 2012-11-29 | Disposition: A | Discharge status: Home / Self Care | Payer: 59 | Source: Ambulatory Visit | Attending: Family Medicine | Admitting: Family Medicine

## 2012-11-29 ENCOUNTER — Other Ambulatory Visit: Payer: Self-pay | Admitting: Family Medicine

## 2012-11-29 DIAGNOSIS — R1032 Left lower quadrant pain: Secondary | ICD-10-CM

## 2012-11-29 MED ORDER — IOHEXOL 300 MG/ML  SOLN
125.0000 mL | Freq: Once | INTRAMUSCULAR | Status: AC | PRN
  Administered 2012-11-29: 125 mL via INTRAVENOUS

## 2013-05-01 ENCOUNTER — Encounter (HOSPITAL_COMMUNITY): Payer: Self-pay | Admitting: *Deleted

## 2013-05-01 ENCOUNTER — Encounter (HOSPITAL_COMMUNITY): Payer: Self-pay | Admitting: Pharmacy Technician

## 2013-05-01 ENCOUNTER — Other Ambulatory Visit: Payer: Self-pay | Admitting: Urology

## 2013-05-01 NOTE — Progress Notes (Signed)
PT STATES SHE CAN'T GET OFF WORK TO COME FOR PREOP VISIT - SHE WAS GIVEN SAMEDAY SURGERY INSTRUCTIONS  INCLUDING BETASEPT SOAP INSTRUCTIONS / PRECAUTIONS.

## 2013-05-02 NOTE — H&P (Signed)
ctive Problems Problems  1. Edema 782.3 2. Hydronephrosis On The Left 591 3. Mid Ureteral Stone On The Left 592.1 4. Nephrolithiasis Bilateral 592.0 5. Pyuria 791.9 6. Renal Cyst 593.2 7. Stress Incontinence 788.39 8. Urinary Incontinence 788.30  History of Present Illness     Janice Guerra returns today in f/u for her stones and renal cysts.  She had an episode of recurrent pain for about 2 weeks starting on 5/27.  She had severe left flank pain with nausea and vomiting and had SUI with the vomiting.  She is doing well now with no symptoms.  She was placed on HCTZ for her stones and ankle edema.  She has had prior hyperparathyroidism that caused her stones but is s/p parathyroidectomy.   She has had several lithotripsies and endoscopic procedures for her stones.  She has a RLP complex renal cyst that was felt to be benign on MRI in June.   She had a pain episode in January and she was found to have left proximal stone.   She has not seen a stone pass through then or after her most recent episode.  A renal US today shows moderate left hydro.  She has small right renal stones and stable cysts.  Her KUB shows small right renal stones but the left is obscured with gas.   Past Medical History Problems  1. History of  Diffuse Abdominal Pain 789.07 2. History of  Gross Hematuria 599.71 3. History of  Hydronephrosis Right 591 4. History of  Hyperparathyroidism 252.00 5. History of  Lumbar Radiculopathy 724.4 6. History of  Nephrolithiasis V13.01 7. History of  Proximal Ureteral Stone On The Right 592.1 8. History of  Ureteral Stone Right 592.1  Surgical History Problems  1. History of  Cystoscopy With Insertion Of Ureteral Stent Right 2. History of  Cystoscopy With Ureteroscopy Right 3. History of  Hysterectomy V45.77 4. History of  Lithotripsy 5. History of  Lithotripsy 6. History of  Nerve Block Transforaminal Epidural 7. History of  Parathyroid Surgery  Current Meds 1. Hydrochlorothiazide  12.5 MG Oral Capsule; TAKE 1 CAPSULE DAILY; Therapy: 19Jun2013 to  (Evaluate:14Jun2014)  Requested for: 19Jun2013; Last Rx:19Jun2013 2. Lotemax 0.5 % Ophthalmic Suspension; Therapy: 14Oct2011 to 3. Restasis 0.05 % Ophthalmic Emulsion; Therapy: 29Oct2011 to 4. Vivelle-Dot 0.05 MG/24HR Transdermal Patch Biweekly; Therapy: 31Jul2011 to  Allergies Medication  1. Penicillins  Family History Problems  1. Paternal history of  Heart Disease V17.49 2. Sororal history of  Nephrolithiasis 3. Paternal history of  Nephrolithiasis 4. Maternal history of  Nephrolithiasis  Social History Problems  1. Caffeine Use 1-2 cups 2. Former Smoker 3. Marital History - Currently Married 4. Occupation: HR manager  Review of Systems Genitourinary, constitutional, skin, eye, otolaryngeal, hematologic/lymphatic, cardiovascular, pulmonary, endocrine, musculoskeletal, gastrointestinal, neurological and psychiatric system(s) were reviewed and pertinent findings if present are noted.  Genitourinary: urinary urgency (just feels full most of the time. ) and incontinence, but no hematuria.  Gastrointestinal: no flank pain.  Constitutional: no recent weight loss.  Gastrointestinal: bloating (and a sensation of swelling that worse with the pain episodes. She still feels full in the lower abdomen.).    Vitals Vital Signs [Data Includes: Last 1 Day]  30Jun2014 04:19PM  BMI Calculated: 28.25 BSA Calculated: 1.93 Weight: 180 lb  Blood Pressure: 134 / 87 Heart Rate: 62  Physical Exam Constitutional: Well nourished and well developed . No acute distress.  Abdomen: The abdomen is soft and nontender. No right CVA tenderness and no left  CVA tenderness.    Results/Data Urine [Data Includes: Last 1 Day]   30Jun2014  COLOR YELLOW   APPEARANCE CLEAR   SPECIFIC GRAVITY 1.015   pH 5.5   GLUCOSE NEG mg/dL  BILIRUBIN NEG   KETONE NEG mg/dL  BLOOD NEG   PROTEIN NEG mg/dL  UROBILINOGEN 0.2 mg/dL  NITRITE NEG    LEUKOCYTE ESTERASE TRACE   SQUAMOUS EPITHELIAL/HPF RARE   WBC 3-6 WBC/hpf  RBC 0-2 RBC/hpf  BACTERIA FEW   CRYSTALS NONE SEEN   CASTS NONE SEEN    The following images/tracing/specimen were independently visualized:  Renal US today shows a 2.08cm RUP cyst and a 1.70cm complex RLP cyst with septation and wall calcification and there is a possible 2cm peripelvic cyst vs fullness. Tehre are 3 small stones measuring 3.3 to 6.1cm in the upper, mid and lower pole. The left kidney has no stones but there is moderate hydro. No masses are noted. The bladder has 83cc and is normal. See study sheet for details. KUB today shows small right renal stones but no obvious left ureteral or renal stones. The film is otherwise unremarkable. I have reviewed her CT from January that was noted on the [facility] upon review of the KUB and [place] and she had a 7mm left proximal stone with hydro. A CT Urogram today shows the 7mm stone has moved to the mid ureter and there is residual hydro. There are 2-3 small right renal stones noted as well. A full report is pending.    Assessment Assessed  1. Hydronephrosis On The Left 591 2. Nephrolithiasis Bilateral 592.0 3. Stress Incontinence 788.39 4. Mid Ureteral Stone On The Left 592.1 5. Renal Cyst 593.2   She has an intermittantly symptomatic left mid ureteral stone with hydronephrosis. She has stable renal cysts and right renal stones. Her incontinence increased with her vomiting episode.   Plan Health Maintenance (V70.0)  1. UA With REFLEX  Done: 30Jun2014 02:57PM Hydronephrosis On The Left (591)  2. Follow-up Schedule Surgery Office  Follow-up  Requested for: 30Jun2014 Hydronephrosis On The Left (591), Nephrolithiasis (592.0)  3. AU CT-STONE PROTOCOL  Done: 30Jun2014 12:00AM Nephrolithiasis (592.0)  4. Hydrochlorothiazide 12.5 MG Oral Capsule; TAKE 1 CAPSULE DAILY; Therapy: 19Jun2013 to  (Evaluate:25Jun2015)  Requested for: 30Jun2014; Last Rx:30Jun2014; Edited   I have  refilled the HCTZ. She needs treatment of the left mid ureteral stone.  It has been trying to pass for 6 months.  It is not apparent on KUB so ESWL is not appropriate.    I will set her up for ureteroscopy with laser and possible stent and reviewed the risks of bleeding, infection, ureteral injury, need for a stent or secondary procedures, thrombotic events and anesthetic complications.   Discussion/Summary        CC: Dr Elias Else.

## 2013-05-03 ENCOUNTER — Encounter (HOSPITAL_COMMUNITY): Payer: Self-pay | Admitting: *Deleted

## 2013-05-03 ENCOUNTER — Encounter (HOSPITAL_COMMUNITY)
Admission: RE | Disposition: A | Discharge status: Home / Self Care | Payer: Self-pay | Source: Ambulatory Visit | Attending: Urology

## 2013-05-03 ENCOUNTER — Ambulatory Visit (HOSPITAL_COMMUNITY)
Admission: RE | Admit: 2013-05-03 | Discharge: 2013-05-03 | Disposition: A | Discharge status: Home / Self Care | Payer: 59 | Source: Ambulatory Visit | Attending: Urology | Admitting: Urology

## 2013-05-03 ENCOUNTER — Ambulatory Visit (HOSPITAL_COMMUNITY): Payer: 59 | Admitting: Anesthesiology

## 2013-05-03 ENCOUNTER — Encounter (HOSPITAL_COMMUNITY): Payer: Self-pay | Admitting: Anesthesiology

## 2013-05-03 DIAGNOSIS — N393 Stress incontinence (female) (male): Secondary | ICD-10-CM | POA: Insufficient documentation

## 2013-05-03 DIAGNOSIS — Q619 Cystic kidney disease, unspecified: Secondary | ICD-10-CM | POA: Insufficient documentation

## 2013-05-03 DIAGNOSIS — N201 Calculus of ureter: Secondary | ICD-10-CM | POA: Insufficient documentation

## 2013-05-03 DIAGNOSIS — E213 Hyperparathyroidism, unspecified: Secondary | ICD-10-CM | POA: Insufficient documentation

## 2013-05-03 DIAGNOSIS — N1330 Unspecified hydronephrosis: Secondary | ICD-10-CM | POA: Insufficient documentation

## 2013-05-03 DIAGNOSIS — Z79899 Other long term (current) drug therapy: Secondary | ICD-10-CM | POA: Insufficient documentation

## 2013-05-03 HISTORY — PX: CYSTOSCOPY WITH RETROGRADE PYELOGRAM, URETEROSCOPY AND STENT PLACEMENT: SHX5789

## 2013-05-03 HISTORY — DX: Personal history of urinary calculi: Z87.442

## 2013-05-03 HISTORY — PX: HOLMIUM LASER APPLICATION: SHX5852

## 2013-05-03 HISTORY — DX: Other intervertebral disc displacement, lumbar region: M51.26

## 2013-05-03 LAB — BASIC METABOLIC PANEL
BUN: 16 mg/dL (ref 6–23)
Creatinine, Ser: 0.71 mg/dL (ref 0.50–1.10)
GFR calc Af Amer: >90 mL/min (ref >90)
GFR calc non Af Amer: >90 mL/min (ref >90)
Glucose, Bld: 105 mg/dL — ABNORMAL HIGH (ref 70–99)
Potassium: 3.6 mEq/L (ref 3.5–5.1)

## 2013-05-03 LAB — CBC
HCT: 37.6 % (ref 36.0–46.0)
Hemoglobin: 12.2 g/dL (ref 12.0–15.0)
MCHC: 32.4 g/dL (ref 30.0–36.0)
MCV: 82.5 fL (ref 78.0–100.0)
RDW: 13.9 % (ref 11.5–15.5)

## 2013-05-03 SURGERY — CYSTOURETEROSCOPY, WITH RETROGRADE PYELOGRAM AND STENT INSERTION
Anesthesia: General | Laterality: Left | Wound class: Clean Contaminated

## 2013-05-03 MED ORDER — PHENAZOPYRIDINE HCL 200 MG PO TABS
200.0000 mg | ORAL_TABLET | Freq: Three times a day (TID) | ORAL | Status: AC
  Administered 2013-05-03: 200 mg via ORAL
  Filled 2013-05-03: qty 1

## 2013-05-03 MED ORDER — OXYCODONE-ACETAMINOPHEN 5-325 MG PO TABS: 1.0000 | ORAL_TABLET | ORAL | Status: DC | PRN

## 2013-05-03 MED ORDER — ACETAMINOPHEN 650 MG RE SUPP
650.0000 mg | RECTAL | Status: DC | PRN
  Filled 2013-05-03: qty 1

## 2013-05-03 MED ORDER — PROPOFOL 10 MG/ML IV BOLUS
INTRAVENOUS | Status: DC | PRN
  Administered 2013-05-03: 200 mg via INTRAVENOUS

## 2013-05-03 MED ORDER — SODIUM CHLORIDE 0.9 % IV SOLN: 250.0000 mL | INTRAVENOUS | Status: DC | PRN

## 2013-05-03 MED ORDER — IOHEXOL 300 MG/ML  SOLN
INTRAMUSCULAR | Status: DC | PRN
  Administered 2013-05-03: 10 mL

## 2013-05-03 MED ORDER — MIDAZOLAM HCL 5 MG/5ML IJ SOLN
INTRAMUSCULAR | Status: DC | PRN
  Administered 2013-05-03: 2 mg via INTRAVENOUS

## 2013-05-03 MED ORDER — SODIUM CHLORIDE 0.9 % IJ SOLN: 3.0000 mL | Freq: Two times a day (BID) | INTRAMUSCULAR | Status: DC

## 2013-05-03 MED ORDER — SODIUM CHLORIDE 0.9 % IR SOLN
Status: DC | PRN
  Administered 2013-05-03: 4000 mL

## 2013-05-03 MED ORDER — ONDANSETRON HCL 4 MG/2ML IJ SOLN: 4.0000 mg | Freq: Four times a day (QID) | INTRAMUSCULAR | Status: DC | PRN

## 2013-05-03 MED ORDER — CIPROFLOXACIN IN D5W 400 MG/200ML IV SOLN
INTRAVENOUS | Status: AC
  Filled 2013-05-03: qty 200

## 2013-05-03 MED ORDER — PROMETHAZINE HCL 25 MG/ML IJ SOLN: 6.2500 mg | INTRAMUSCULAR | Status: DC | PRN

## 2013-05-03 MED ORDER — FENTANYL CITRATE 0.05 MG/ML IJ SOLN: 25.0000 ug | INTRAMUSCULAR | Status: DC | PRN

## 2013-05-03 MED ORDER — KETOROLAC TROMETHAMINE 30 MG/ML IJ SOLN
15.0000 mg | Freq: Once | INTRAMUSCULAR | Status: AC | PRN
  Administered 2013-05-03: 30 mg via INTRAVENOUS

## 2013-05-03 MED ORDER — SODIUM CHLORIDE 0.9 % IJ SOLN: 3.0000 mL | INTRAMUSCULAR | Status: DC | PRN

## 2013-05-03 MED ORDER — ONDANSETRON HCL 4 MG/2ML IJ SOLN
INTRAMUSCULAR | Status: DC | PRN
  Administered 2013-05-03: 4 mg via INTRAVENOUS

## 2013-05-03 MED ORDER — INDIGOTINDISULFONATE SODIUM 8 MG/ML IJ SOLN
INTRAMUSCULAR | Status: AC
  Filled 2013-05-03: qty 5

## 2013-05-03 MED ORDER — FENTANYL CITRATE 0.05 MG/ML IJ SOLN
INTRAMUSCULAR | Status: DC | PRN
  Administered 2013-05-03: 50 ug via INTRAVENOUS
  Administered 2013-05-03: 25 ug via INTRAVENOUS

## 2013-05-03 MED ORDER — DEXAMETHASONE SODIUM PHOSPHATE 10 MG/ML IJ SOLN
INTRAMUSCULAR | Status: DC | PRN
  Administered 2013-05-03: 10 mg via INTRAVENOUS

## 2013-05-03 MED ORDER — LACTATED RINGERS IV SOLN
INTRAVENOUS | Status: DC
  Administered 2013-05-03: 13:00:00 via INTRAVENOUS
  Administered 2013-05-03: 1000 mL via INTRAVENOUS

## 2013-05-03 MED ORDER — IOHEXOL 300 MG/ML  SOLN
INTRAMUSCULAR | Status: AC
  Filled 2013-05-03: qty 1

## 2013-05-03 MED ORDER — PHENAZOPYRIDINE HCL 200 MG PO TABS: 200.0000 mg | ORAL_TABLET | Freq: Three times a day (TID) | ORAL | Status: DC | PRN

## 2013-05-03 MED ORDER — ACETAMINOPHEN 325 MG PO TABS: 650.0000 mg | ORAL_TABLET | ORAL | Status: DC | PRN

## 2013-05-03 MED ORDER — BELLADONNA ALKALOIDS-OPIUM 16.2-60 MG RE SUPP
RECTAL | Status: DC | PRN
  Administered 2013-05-03: 1 via RECTAL

## 2013-05-03 MED ORDER — BELLADONNA ALKALOIDS-OPIUM 16.2-60 MG RE SUPP
RECTAL | Status: AC
  Filled 2013-05-03: qty 1

## 2013-05-03 MED ORDER — KETOROLAC TROMETHAMINE 30 MG/ML IJ SOLN
INTRAMUSCULAR | Status: AC
  Filled 2013-05-03: qty 1

## 2013-05-03 MED ORDER — OXYCODONE HCL 5 MG PO TABS: 5.0000 mg | ORAL_TABLET | ORAL | Status: DC | PRN

## 2013-05-03 MED ORDER — CIPROFLOXACIN IN D5W 400 MG/200ML IV SOLN
400.0000 mg | INTRAVENOUS | Status: AC
  Administered 2013-05-03: 400 mg via INTRAVENOUS

## 2013-05-03 SURGICAL SUPPLY — 15 items
BAG URO CATCHER STRL LF (DRAPE) ×2 IMPLANT
BASKET ZERO TIP NITINOL 2.4FR (BASKET) ×2 IMPLANT
CATH URET 5FR 28IN OPEN ENDED (CATHETERS) ×2 IMPLANT
CLOTH BEACON ORANGE TIMEOUT ST (SAFETY) ×2 IMPLANT
DRAPE CAMERA CLOSED 9X96 (DRAPES) ×2 IMPLANT
GLOVE SURG SS PI 8.0 STRL IVOR (GLOVE) ×2 IMPLANT
GOWN PREVENTION PLUS XLARGE (GOWN DISPOSABLE) ×2 IMPLANT
GOWN STRL REIN XL XLG (GOWN DISPOSABLE) ×2 IMPLANT
LASER FIBER DISP (UROLOGICAL SUPPLIES) ×2 IMPLANT
MANIFOLD NEPTUNE II (INSTRUMENTS) ×2 IMPLANT
MARKER SKIN DUAL TIP RULER LAB (MISCELLANEOUS) IMPLANT
PACK CYSTO (CUSTOM PROCEDURE TRAY) ×2 IMPLANT
SHEATH ACCESS URETERAL 38CM (SHEATH) ×2 IMPLANT
STENT CONTOUR 6FRX26X.038 (STENTS) ×2 IMPLANT
TUBING CONNECTING 10 (TUBING) ×2 IMPLANT

## 2013-05-03 NOTE — Interval H&P Note (Signed)
History and Physical Interval Note:  05/03/2013 11:49 AM  Janice Guerra  has presented today for surgery, with the diagnosis of LEFT MID URETERAL STONE   The various methods of treatment have been discussed with the patient and family. After consideration of risks, benefits and other options for treatment, the patient has consented to  Procedure(s): CYSTOSCOPY WITH LEFT RETROGRADE PYELOGRAM, LEFT URETEROSCOPY AND POSSIBLE STENT PLACEMENT (Left) HOLMIUM LASER APPLICATION (N/A) as a surgical intervention .  The patient's history has been reviewed, patient examined, no change in status, stable for surgery.  I have reviewed the patient's chart and labs.  Questions were answered to the patient's satisfaction.     Simeon Vera J

## 2013-05-03 NOTE — Brief Op Note (Signed)
05/03/2013  12:42 PM  PATIENT:  Janice Guerra  57 y.o. female  PRE-OPERATIVE DIAGNOSIS:  LEFT MID URETERAL STONE   POST-OPERATIVE DIAGNOSIS:  LEFT MID URETERAL STONE   PROCEDURE:  Procedure(s): CYSTOSCOPY WITH LEFT RETROGRADE PYELOGRAM, LEFT URETEROSCOPY AND LEFT URETERAL STENT PLACEMENT (Left) HOLMIUM LASER APPLICATION (Left)  SURGEON:  Surgeon(s) and Role:    * Anner Crete, MD - Primary  PHYSICIAN ASSISTANT:   ASSISTANTS: none   ANESTHESIA:   general  EBL:     BLOOD ADMINISTERED:none  DRAINS: 6x26 left JJ stent   LOCAL MEDICATIONS USED:  NONE  SPECIMEN:  Source of Specimen:  left ureteral stone  DISPOSITION OF SPECIMEN:  to family to bring to office.   COUNTS:  YES  TOURNIQUET:  * No tourniquets in log *  DICTATION: .Other Dictation: Dictation Number [id number]  PLAN OF CARE: Discharge to home after PACU  PATIENT DISPOSITION:  PACU - hemodynamically stable.   Delay start of Pharmacological VTE agent (>24hrs) due to surgical blood loss or risk of bleeding: not applicable

## 2013-05-03 NOTE — Anesthesia Preprocedure Evaluation (Signed)

## 2013-05-03 NOTE — Interval H&P Note (Signed)
History and Physical Interval Note:  05/03/2013 11:48 AM  Janice Guerra  has presented today for surgery, with the diagnosis of LEFT MID URETERAL STONE   The various methods of treatment have been discussed with the patient and family. After consideration of risks, benefits and other options for treatment, the patient has consented to  Procedure(s): CYSTOSCOPY WITH LEFT RETROGRADE PYELOGRAM, LEFT URETEROSCOPY AND POSSIBLE STENT PLACEMENT (Left) HOLMIUM LASER APPLICATION (N/A) as a surgical intervention .  The patient's history has been reviewed, patient examined, no change in status, stable for surgery.  I have reviewed the patient's chart and labs.  Questions were answered to the patient's satisfaction.     [name] J

## 2013-05-03 NOTE — Op Note (Signed)
Janice Guerra, Janice Guerra NO.:  [id number]  MEDICAL RECORD NO.:  [id number]  LOCATION:  [facility]                         FACILITY:  [facility]  PHYSICIAN:  Excell Seltzer. Annabell Howells, M.D.    DATE OF BIRTH:  [birth year]  DATE OF PROCEDURE:  05/03/2013 DATE OF DISCHARGE:  05/03/2013                              OPERATIVE REPORT   PROCEDURE:  Cystoscopy, left retrograde pyelogram, left ureteroscopic stone extraction with holmium lasertripsy, insertion of left double-J stent.  PREOPERATIVE DIAGNOSIS:  Left mid ureteral stone.  POSTOPERATIVE DIAGNOSIS:  Left mid ureteral stone.  SURGEON:  Excell Seltzer. Annabell Howells, M.D.  ANESTHESIA:  General.  SPECIMEN:  Stone fragments.  DRAINS:  A 6-French 26-cm left double-J stent.  BLOOD LOSS:  Minimal.  COMPLICATIONS:  None.  INDICATIONS:  Ms. Raetz is a 57 year old African American female with a history of stones, who was seen in January by her primary care physician and was found to have about a 7 mm left UPJ stone.  Her pain abated, although she never passed the stone, and she did not seek followup.  She returned to our office for her routine followup but had had a couple of weeks of intermittent pain back in May.  I felt a CT scan was indicated to evaluate for the presence of the stone since it was not apparent on KUB and she had moderate hydronephrosis on renal ultrasound.  The CT scan confirmed a 7 mm mid ureteral stone, and after reviewing the options, we elected to proceed with a ureteroscopy.  FINDINGS OF PROCEDURE:  She was given Cipro.  She was taken to the operating room where general anesthetic was induced.  She was placed in lithotomy position and fitted with PAS hose.  Her perineum and genitalia were prepped with Betadine solution.  She was draped in usual sterile fashion.  Cystoscopy was performed using a 22-French scope and 12-degree lens. Examination revealed a normal urethra.  The bladder wall was smooth and pale without tumor,  stones, or inflammation.  The ureteral orifices were unremarkable.  The left ureteral orifice was cannulated with 5-French open-end catheter and contrast was instilled.  This demonstrated a normal distal ureter without narrowing.  The stone was noted as a filling defect at about the level of the iliacs.  Proximal to that, there were some dilation to the kidney.  A guidewire was then passed through the open-ended catheter to the level of stone.  There was resistance at the stone, so the open-ended into catheter was advanced to just below the stone and the wire was then successfully passed to the kidney.  There was some resistance during passage, but the wire appeared to coil in the kidney.  At this point, a 12-French ureteral access sheath inner core was used to dilate the ureter to the level of the stone.  A 6.4-French short ureteroscope was then passed alongside the wire up to ureteral orifice.  The stone was readily visualized.  It was tannish in appearance and appeared to be consistent with a mixed calcium oxalate stone.  Once the stone had been visualized, a 3.65 micron holmium laser fiber was advanced through the ureteroscope and the stone was  engaged with the laser at 0.5 watts and 20 hertz.  The stone fragment and some of the fragments moved proximally.  The power was increased to 1 and the frequency was reduced to 10 and additional fragmentation was performed.  Once adequate fragmentation was performed, the stone fragments were removed with a Nitinol basket.  The ureter was inspected all the way to the kidney confirming removal of all significant fragments.  Of note, during the procedure, the ureteral wire was noted to go submucosal and back into the lumina at the level of the stone.  In light of this, after all stone fragments were removed, with the ureteroscope in the ureter, the wire was removed and reinserted through the ureteroscope to the kidney to ensure free  passage.  Ureteroscope was then removed.  The cystoscope was reinserted over the wire and a 6-French 26 cm double-J stent with string was inserted over the wire to the kidney.  The wire was removed leaving good coil in the kidney and a good coil in the bladder.  The bladder was then drained. The stent string was left exiting urethra and was tied close to the meatus and trimmed.  B and O suppository was placed.  She was taken down from lithotomy position.  Her anesthetic was reversed.  She was moved to recovery in stable condition.  There were no complications.     Excell Seltzer. Annabell Howells, M.D.     JJW/MEDQ  D:  05/03/2013  T:  05/03/2013  Job:  [id number]

## 2013-05-03 NOTE — Transfer of Care (Signed)
Immediate Anesthesia Transfer of Care Note  Patient: Janice Guerra  Procedure(s) Performed: Procedure(s): CYSTOSCOPY WITH LEFT RETROGRADE PYELOGRAM, LEFT URETEROSCOPY AND LEFT URETERAL STENT PLACEMENT (Left) HOLMIUM LASER APPLICATION (Left)  Patient Location: PACU  Anesthesia Type:General  Level of Consciousness: awake, sedated and patient cooperative  Airway & Oxygen Therapy: Patient Spontanous Breathing and Patient connected to face mask oxygen  Post-op Assessment: Report given to PACU RN and Post -op Vital signs reviewed and stable  Post vital signs: Reviewed and stable  Complications: No apparent anesthesia complications

## 2013-05-03 NOTE — Anesthesia Postprocedure Evaluation (Signed)
  Anesthesia Post-op Note  Patient: Janice Guerra  Procedure(s) Performed: Procedure(s) (LRB): CYSTOSCOPY WITH LEFT RETROGRADE PYELOGRAM, LEFT URETEROSCOPY AND LEFT URETERAL STENT PLACEMENT (Left) HOLMIUM LASER APPLICATION (Left)  Patient Location: PACU  Anesthesia Type: General  Level of Consciousness: awake and alert   Airway and Oxygen Therapy: Patient Spontanous Breathing  Post-op Pain: mild  Post-op Assessment: Post-op Vital signs reviewed, Patient's Cardiovascular Status Stable, Respiratory Function Stable, Patent Airway and No signs of Nausea or vomiting  Last Vitals:  Filed Vitals:   05/03/13 1438  BP: 128/82  Pulse: 60  Temp:   Resp: 16    Post-op Vital Signs: stable   Complications: No apparent anesthesia complications

## 2013-05-07 ENCOUNTER — Encounter (HOSPITAL_COMMUNITY): Payer: Self-pay | Admitting: Urology

## 2013-05-10 ENCOUNTER — Other Ambulatory Visit: Payer: Self-pay

## 2013-05-10 DIAGNOSIS — Z1231 Encounter for screening mammogram for malignant neoplasm of breast: Secondary | ICD-10-CM

## 2013-06-25 ENCOUNTER — Ambulatory Visit
Admission: RE | Admit: 2013-06-25 | Discharge: 2013-06-25 | Disposition: A | Discharge status: Home / Self Care | Payer: 59 | Source: Ambulatory Visit

## 2013-06-25 DIAGNOSIS — Z1231 Encounter for screening mammogram for malignant neoplasm of breast: Secondary | ICD-10-CM

## 2013-08-18 ENCOUNTER — Emergency Department (HOSPITAL_COMMUNITY)
Admission: EM | Admit: 2013-08-18 | Discharge: 2013-08-18 | Disposition: A | Discharge status: Home / Self Care | Payer: 59 | Attending: Emergency Medicine | Admitting: Emergency Medicine

## 2013-08-18 ENCOUNTER — Encounter (HOSPITAL_COMMUNITY): Payer: Self-pay | Admitting: Emergency Medicine

## 2013-08-18 DIAGNOSIS — Z79899 Other long term (current) drug therapy: Secondary | ICD-10-CM | POA: Insufficient documentation

## 2013-08-18 DIAGNOSIS — Z87442 Personal history of urinary calculi: Secondary | ICD-10-CM | POA: Insufficient documentation

## 2013-08-18 DIAGNOSIS — Z880 Allergy status to penicillin: Secondary | ICD-10-CM | POA: Insufficient documentation

## 2013-08-18 DIAGNOSIS — K645 Perianal venous thrombosis: Secondary | ICD-10-CM | POA: Insufficient documentation

## 2013-08-18 DIAGNOSIS — IMO0002 Reserved for concepts with insufficient information to code with codable children: Secondary | ICD-10-CM | POA: Insufficient documentation

## 2013-08-18 DIAGNOSIS — Z8739 Personal history of other diseases of the musculoskeletal system and connective tissue: Secondary | ICD-10-CM | POA: Insufficient documentation

## 2013-08-18 MED ORDER — LIDOCAINE-EPINEPHRINE 1 %-1:100000 IJ SOLN
10.0000 mL | Freq: Once | INTRAMUSCULAR | Status: AC
  Administered 2013-08-18: 10 mL
  Filled 2013-08-18 (×2): qty 10

## 2013-08-18 MED ORDER — DOCUSATE SODIUM 100 MG PO CAPS: 100.0000 mg | ORAL_CAPSULE | Freq: Two times a day (BID) | ORAL | Status: DC

## 2013-08-18 MED ORDER — HYDROCORTISONE 2.5 % RE CREA: TOPICAL_CREAM | Freq: Two times a day (BID) | RECTAL | Status: DC

## 2013-08-18 NOTE — ED Notes (Signed)
Pt from home, saw [facility] yesterday and was told that she needed to be seen since she has had a bleeding hemorrhoid x3 days. Pt denies N/V/D,fever, CP, SOB. Pt is A&O and in NAD

## 2013-08-18 NOTE — ED Provider Notes (Signed)
CSN: [id number]     Arrival date & time 08/18/13  0903 History   First MD Initiated Contact with Patient 08/18/13 [phone]     Chief Complaint  Patient presents with  . Hemorrhoids   (Consider location/radiation/quality/duration/timing/severity/associated sxs/prior Treatment) HPI  56yF with rectal bleeding. Onset about 3d ago. Gradually worsening. Seen as outpt in clinic and told had bleeding hemorrhoid. Persistent oozing requiring use of a pad. Localized pain. No abdominal pain. No blood thinners. No dizziness, lightheadedness or SOB. No hx of hemorrhoidectomy or other rectal/colon procedures. No intervention prior to arrival.   Past Medical History  Diagnosis Date  . History of kidney stones   . Headache(784.0)     due to shingles 5 yr ago -RESOLVED  . HNP (herniated nucleus pulposus), lumbar     EPIDURAL 2013 - NO SURGERY - STILL HAS OCCAS BACK PAIN   Past Surgical History  Procedure Laterality Date  . Lower rt parathyroid gland removed  20012  . Abdominal hysterectomy    . Rt hand trigger finger  2008  . Lithotripsy  2012    for kidney stones x 3  . Lithotripsy      3 WITHIN PAST 18 MONTHS  . Cystoscopy with retrograde pyelogram, ureteroscopy and stent placement Left 05/03/2013    Procedure: CYSTOSCOPY WITH LEFT RETROGRADE PYELOGRAM, LEFT URETEROSCOPY AND LEFT URETERAL STENT PLACEMENT;  Surgeon: Anner Crete, MD;  Location: [facility];  Service: Urology;  Laterality: Left;  . Holmium laser application Left 05/03/2013    Procedure: HOLMIUM LASER APPLICATION;  Surgeon: Anner Crete, MD;  Location: [facility];  Service: Urology;  Laterality: Left;   Family History  Problem Relation Age of Onset  . Colon cancer Paternal Uncle 59  . Colon cancer Maternal Grandfather 27  . Stomach cancer Neg Hx    History  Substance Use Topics  . Smoking status: Never Smoker   . Smokeless tobacco: Never Used  . Alcohol Use: No   OB History   Grav Para Term Preterm Abortions TAB SAB Ect Mult Living              Review of Systems  All systems reviewed and negative, other than as noted in HPI.   Allergies  Penicillins  Home Medications   Current Outpatient Rx  Name  Route  Sig  Dispense  Refill  . Biotin 1000 MCG tablet   Oral   Take 1,000 mcg by mouth 3 (three) times daily.          . cholecalciferol (VITAMIN D) 1000 UNITS tablet   Oral   Take 1,000 Units by mouth daily.         Marland Kitchen CINNAMON PO   Oral   Take 1 tablet by mouth daily.          . cycloSPORINE (RESTASIS) 0.05 % ophthalmic emulsion   Both Eyes   Place 1 drop into both eyes daily.           Marland Kitchen estradiol (VIVELLE-DOT) 0.05 MG/24HR   Transdermal   Place 1 patch onto the skin 2 (two) times a week.          Marland Kitchen ibuprofen (ADVIL,MOTRIN) 200 MG tablet   Oral   Take 200 mg by mouth every 6 (six) hours as needed for pain.         . Multiple Vitamins-Minerals (MULTIVITAMINS THER. W/MINERALS) TABS   Oral   Take 1 tablet by mouth daily.          Marland Kitchen  phentermine 37.5 MG capsule   Oral   Take 37.5 mg by mouth every morning.         . vitamin B-12 (CYANOCOBALAMIN) 1000 MCG tablet   Oral   Take 1,000 mcg by mouth daily.           Marland Kitchen docusate sodium (COLACE) 100 MG capsule   Oral   Take 1 capsule (100 mg total) by mouth every 12 (twelve) hours.   60 capsule   0   . hydrocortisone (PROCTOSOL HC) 2.5 % rectal cream   Rectal   Place rectally 2 (two) times daily.   30 g   0    BP 131/76  Pulse 78  Temp(Src) 98.2 F (36.8 C) (Oral)  Resp 16  Ht 5\' 7"  (1.702 m)  Wt 185 lb (83.915 kg)  BMI 28.97 kg/m2  SpO2 99% Physical Exam  Nursing note and vitals reviewed. Constitutional: She appears well-developed and well-nourished. No distress.  HENT:  Head: Normocephalic and atraumatic.  Eyes: Conjunctivae are normal. Right eye exhibits no discharge. Left eye exhibits no discharge.  Neck: Neck supple.  Cardiovascular: Normal rate, regular rhythm and normal heart sounds.  Exam reveals no gallop  and no friction rub.   No murmur heard. Pulmonary/Chest: Effort normal and breath sounds normal. No respiratory distress.  Abdominal: Soft. She exhibits no distension. There is no tenderness.  Genitourinary:  Thrombosed external hemorrhoid at approximately the 3:00 position. Minor bleeding. Extremely tender to touch. No purulent drainage or  other signs of infection.  Musculoskeletal: She exhibits no edema and no tenderness.  Neurological: She is alert.  Skin: Skin is warm and dry.  Psychiatric: She has a normal mood and affect. Her behavior is normal. Thought content normal.    ED Course  Procedures (including critical care time)  External Hemorrhoid Thrombectomy  Verbal consent for external hemorrhoid thrombectomy was obtained. Discussed risks including procedural and post-procedure pain, medication side effects, bleeding, infection, need for repeat procedure, injury to anal sphincter or other surrounding structures, formation of skin tags, etc. Provider confirms review of the nurses' note, allergies, medications, pertinent labs, PMHx. Pre-procedure vital signs, pulse oximetry, pain level, and patient condition satisfactory for commencing with procedure.  Patient was laid prone in bed. Buttocks were retracted laterally and held with tape and additional manual retraction with assistance of tech, Fleet Contras. Base of hemorrhoid, overlying skin and hemorrhoid itself was injected with 6cc of 1% lidocaine with epinephrine after thoroughly wiping with alcohol. Once adequate analgesia was obtained, the area was widely prepped with betadine and drape applied. An elliptical incision was made with a 11 blade overlying the hemorrhoid in a radial fashion away from the anal orifice.  Clot was removed with hemostat. Very minimal bleeding. The cavity was then gently packed with 1/4 inch iodoform gauze with loose gauze dressing applied over top. Pt tolerated procedure well with no apparent complications.  Labs  Review Labs Reviewed - No data to display Imaging Review No results found.  EKG Interpretation   None       MDM   1. Thrombosed external hemorrhoid    57 year old female with a thrombosed external hemorrhoid. She underwent a bedside thrombectomy. Sitz baths and continued wound care was discussed. Stool softeners, high fiber diet, avoiding prolonged sitting, straining, etc. Surgical referral information provided for further management as needed.    Raeford Razor, MD 08/22/13 2351

## 2014-05-29 ENCOUNTER — Other Ambulatory Visit: Payer: Self-pay

## 2014-05-29 DIAGNOSIS — Z1231 Encounter for screening mammogram for malignant neoplasm of breast: Secondary | ICD-10-CM

## 2014-07-15 ENCOUNTER — Ambulatory Visit
Admission: RE | Admit: 2014-07-15 | Discharge: 2014-07-15 | Disposition: A | Discharge status: Home / Self Care | Payer: 59 | Source: Ambulatory Visit

## 2014-07-15 DIAGNOSIS — Z1231 Encounter for screening mammogram for malignant neoplasm of breast: Secondary | ICD-10-CM

## 2015-06-16 ENCOUNTER — Other Ambulatory Visit: Payer: Self-pay

## 2015-06-16 DIAGNOSIS — Z1231 Encounter for screening mammogram for malignant neoplasm of breast: Secondary | ICD-10-CM

## 2015-08-11 ENCOUNTER — Ambulatory Visit: Payer: Self-pay

## 2015-09-22 ENCOUNTER — Ambulatory Visit
Admission: RE | Admit: 2015-09-22 | Discharge: 2015-09-22 | Disposition: A | Discharge status: Home / Self Care | Payer: 59 | Source: Ambulatory Visit

## 2015-09-22 ENCOUNTER — Ambulatory Visit: Payer: Self-pay

## 2015-09-22 DIAGNOSIS — Z1231 Encounter for screening mammogram for malignant neoplasm of breast: Secondary | ICD-10-CM

## 2016-08-18 ENCOUNTER — Other Ambulatory Visit: Payer: Self-pay | Admitting: Obstetrics & Gynecology

## 2016-08-18 DIAGNOSIS — Z1231 Encounter for screening mammogram for malignant neoplasm of breast: Secondary | ICD-10-CM

## 2016-10-04 ENCOUNTER — Ambulatory Visit
Admission: RE | Admit: 2016-10-04 | Discharge: 2016-10-04 | Disposition: A | Discharge status: Home / Self Care | Payer: BLUE CROSS/BLUE SHIELD | Source: Ambulatory Visit | Attending: Obstetrics & Gynecology | Admitting: Obstetrics & Gynecology

## 2016-10-04 DIAGNOSIS — Z1231 Encounter for screening mammogram for malignant neoplasm of breast: Secondary | ICD-10-CM

## 2016-12-06 ENCOUNTER — Encounter: Payer: Self-pay | Admitting: Gastroenterology

## 2017-02-09 ENCOUNTER — Encounter: Payer: 59 | Admitting: Gastroenterology

## 2017-02-15 DIAGNOSIS — H00029 Hordeolum internum unspecified eye, unspecified eyelid: Secondary | ICD-10-CM

## 2017-02-15 HISTORY — DX: Hordeolum internum unspecified eye, unspecified eyelid: H00.029

## 2017-02-18 ENCOUNTER — Other Ambulatory Visit: Payer: Self-pay | Admitting: Urology

## 2017-02-22 ENCOUNTER — Encounter (HOSPITAL_COMMUNITY): Payer: Self-pay

## 2017-02-24 ENCOUNTER — Encounter (HOSPITAL_COMMUNITY)
Admission: RE | Disposition: A | Discharge status: Home / Self Care | Payer: Self-pay | Source: Ambulatory Visit | Attending: Urology

## 2017-02-24 ENCOUNTER — Ambulatory Visit (HOSPITAL_COMMUNITY)
Admission: RE | Admit: 2017-02-24 | Discharge: 2017-02-24 | Disposition: A | Discharge status: Home / Self Care | Payer: Managed Care, Other (non HMO) | Source: Ambulatory Visit | Attending: Urology | Admitting: Urology

## 2017-02-24 ENCOUNTER — Encounter (HOSPITAL_COMMUNITY): Payer: Self-pay | Admitting: General Practice

## 2017-02-24 ENCOUNTER — Ambulatory Visit (HOSPITAL_COMMUNITY): Payer: Managed Care, Other (non HMO)

## 2017-02-24 DIAGNOSIS — I10 Essential (primary) hypertension: Secondary | ICD-10-CM | POA: Insufficient documentation

## 2017-02-24 DIAGNOSIS — E669 Obesity, unspecified: Secondary | ICD-10-CM | POA: Insufficient documentation

## 2017-02-24 DIAGNOSIS — N201 Calculus of ureter: Secondary | ICD-10-CM | POA: Diagnosis not present

## 2017-02-24 DIAGNOSIS — Z6831 Body mass index (BMI) 31.0-31.9, adult: Secondary | ICD-10-CM | POA: Diagnosis not present

## 2017-02-24 HISTORY — DX: Essential (primary) hypertension: I10

## 2017-02-24 HISTORY — DX: Hordeolum internum unspecified eye, unspecified eyelid: H00.029

## 2017-02-24 HISTORY — PX: EXTRACORPOREAL SHOCK WAVE LITHOTRIPSY: SHX1557

## 2017-02-24 SURGERY — LITHOTRIPSY, ESWL
Anesthesia: LOCAL | Laterality: Right

## 2017-02-24 MED ORDER — OXYCODONE-ACETAMINOPHEN 5-325 MG PO TABS: 1.0000 | ORAL_TABLET | Freq: Four times a day (QID) | ORAL | 0 refills | Status: DC | PRN

## 2017-02-24 MED ORDER — DIAZEPAM 5 MG PO TABS
10.0000 mg | ORAL_TABLET | ORAL | Status: AC
  Administered 2017-02-24: 10 mg via ORAL
  Filled 2017-02-24: qty 2

## 2017-02-24 MED ORDER — SENNOSIDES-DOCUSATE SODIUM 8.6-50 MG PO TABS: 1.0000 | ORAL_TABLET | Freq: Two times a day (BID) | ORAL | 0 refills | Status: DC

## 2017-02-24 MED ORDER — CIPROFLOXACIN HCL 500 MG PO TABS
500.0000 mg | ORAL_TABLET | ORAL | Status: AC
  Administered 2017-02-24: 500 mg via ORAL
  Filled 2017-02-24: qty 1

## 2017-02-24 MED ORDER — ONDANSETRON 4 MG PO TBDP: 4.0000 mg | ORAL_TABLET | Freq: Three times a day (TID) | ORAL | 0 refills | Status: DC | PRN

## 2017-02-24 MED ORDER — DIPHENHYDRAMINE HCL 25 MG PO CAPS
25.0000 mg | ORAL_CAPSULE | ORAL | Status: AC
  Administered 2017-02-24: 25 mg via ORAL
  Filled 2017-02-24: qty 1

## 2017-02-24 MED ORDER — SODIUM CHLORIDE 0.9 % IV SOLN
INTRAVENOUS | Status: DC
  Administered 2017-02-24: 09:00:00 via INTRAVENOUS

## 2017-02-24 NOTE — Discharge Instructions (Signed)
1 - You may have urinary urgency (bladder spasms), pass small stone fragments,  and bloody urine on / off x few days. This is normal.  2 - Call MD or go to ER for fever >102, severe pain / nausea / vomiting not relieved by medications, or acute change in medical status    Lithotripsy, Care After This sheet gives you information about how to care for yourself after your procedure. Your health care provider may also give you more specific instructions. If you have problems or questions, contact your health care provider. What can I expect after the procedure? After the procedure, it is common to have:  Some blood in your urine. This should only last for a few days.  Soreness in your back, sides, or upper abdomen for a few days.  Blotches or bruises on your back where the pressure wave entered the skin.  Pain, discomfort, or nausea when pieces (fragments) of the kidney stone move through the tube that carries urine from the kidney to the bladder (ureter). Stone fragments may pass soon after the procedure, but they may continue to pass for up to 4-8 weeks.  If you have severe pain or nausea, contact your health care provider. This may be caused by a large stone that was not broken up, and this may mean that you need more treatment.  Some pain or discomfort during urination.  Some pain or discomfort in the lower abdomen or (in men) at the base of the penis. Follow these instructions at home: Medicines   Take over-the-counter and prescription medicines only as told by your health care provider.  If you were prescribed an antibiotic medicine, take it as told by your health care provider. Do not stop taking the antibiotic even if you start to feel better.  Do not drive for 24 hours if you were given a medicine to help you relax (sedative).  Do not drive or use heavy machinery while taking prescription pain medicine. Eating and drinking   Drink enough water and fluids to keep your urine  clear or pale yellow. This helps any remaining pieces of the stone to pass. It can also help prevent new stones from forming.  Eat plenty of fresh fruits and vegetables.  Follow instructions from your health care provider about eating and drinking restrictions. You may be instructed:  To reduce how much salt (sodium) you eat or drink. Check ingredients and nutrition facts on packaged foods and beverages.  To reduce how much meat you eat.  Eat the recommended amount of calcium for your age and gender. Ask your health care provider how much calcium you should have. General instructions   Get plenty of rest.  Most people can resume normal activities 1-2 days after the procedure. Ask your health care provider what activities are safe for you.  If directed, strain all urine through the strainer that was provided by your health care provider.  Keep all fragments for your health care provider to see. Any stones that are found may be sent to a medical lab for examination. The stone may be as small as a grain of salt.  Keep all follow-up visits as told by your health care provider. This is important. Contact a health care provider if:  You have pain that is severe or does not get better with medicine.  You have nausea that is severe or does not go away.  You have blood in your urine longer than your health care provider told you  to expect.  You have more blood in your urine.  You have pain during urination that does not go away.  You urinate more frequently than usual and this does not go away.  You develop a rash or any other possible signs of an allergic reaction. Get help right away if:  You have severe pain in your back, sides, or upper abdomen.  You have severe pain while urinating.  Your urine is very dark red.  You have blood in your stool (feces).  You cannot pass any urine at all.  You feel a strong urge to urinate after emptying your bladder.  You have a fever or  chills.  You develop shortness of breath, difficulty breathing, or chest pain.  You have severe nausea that leads to persistent vomiting.  You faint. Summary  After this procedure, it is common to have some pain, discomfort, or nausea when pieces (fragments) of the kidney stone move through the tube that carries urine from the kidney to the bladder (ureter). If this pain or nausea is severe, however, you should contact your health care provider.  Most people can resume normal activities 1-2 days after the procedure. Ask your health care provider what activities are safe for you.  Drink enough water and fluids to keep your urine clear or pale yellow. This helps any remaining pieces of the stone to pass, and it can help prevent new stones from forming.  If directed, strain your urine and keep all fragments for your health care provider to see. Fragments or stones may be as small as a grain of salt.  Get help right away if you have severe pain in your back, sides, or upper abdomen or have severe pain while urinating. This information is not intended to replace advice given to you by your health care provider. Make sure you discuss any questions you have with your health care provider. Document Released: 11/07/2007 Document Revised: 09/08/2016 Document Reviewed: 09/08/2016 Elsevier Interactive Patient Education  2017 [employer].         Moderate Conscious Sedation, Adult, Care After These instructions provide you with information about caring for yourself after your procedure. Your health care provider may also give you more specific instructions. Your treatment has been planned according to current medical practices, but problems sometimes occur. Call your health care provider if you have any problems or questions after your procedure. What can I expect after the procedure? After your procedure, it is common:  To feel sleepy for several hours.  To feel clumsy and have poor balance  for several hours.  To have poor judgment for several hours.  To vomit if you eat too soon. Follow these instructions at home: For at least 24 hours after the procedure:    Do not:  Participate in activities where you could fall or become injured.  Drive.  Use heavy machinery.  Drink alcohol.  Take sleeping pills or medicines that cause drowsiness.  Make important decisions or sign legal documents.  Take care of children on your own.  Rest. Eating and drinking   Follow the diet recommended by your health care provider.  If you vomit:  Drink water, juice, or soup when you can drink without vomiting.  Make sure you have little or no nausea before eating solid foods. General instructions   Have a responsible adult stay with you until you are awake and alert.  Take over-the-counter and prescription medicines only as told by your health care provider.  If  you smoke, do not smoke without supervision.  Keep all follow-up visits as told by your health care provider. This is important. Contact a health care provider if:  You keep feeling nauseous or you keep vomiting.  You feel light-headed.  You develop a rash.  You have a fever. Get help right away if:  You have trouble breathing. This information is not intended to replace advice given to you by your health care provider. Make sure you discuss any questions you have with your health care provider. Document Released: 08/08/2013 Document Revised: 03/22/2016 Document Reviewed: 02/07/2016 Elsevier Interactive Patient Education  2017 [employer].

## 2017-02-24 NOTE — H&P (Signed)
Janice Guerra is an 61 y.o. female.    Chief Complaint: Pre-op RIGHT Shockwave Lithotripsy  HPI:   1 - RIGHT Proximal Ureteral Stone - Rt 4mm UPJ sotne by KUB 01/2017 on eval flank pain. Long h/o recurrent stones. Most recent UCX negative. NO interval fevers.  Today "Tmya" is seen to proceed with RIGHT shockwave lithotripsy.   Past Medical History:  Diagnosis Date  . Headache(784.0)    due to shingles 5 yr ago -RESOLVED  . History of kidney stones   . HNP (herniated nucleus pulposus), lumbar    EPIDURAL 2013 - NO SURGERY - STILL HAS OCCAS BACK PAIN  . Hypertension   . Sty, internal 02/15/2017   right eye    Past Surgical History:  Procedure Laterality Date  . ABDOMINAL HYSTERECTOMY    . CYSTOSCOPY WITH RETROGRADE PYELOGRAM, URETEROSCOPY AND STENT PLACEMENT Left 05/03/2013   Procedure: CYSTOSCOPY WITH LEFT RETROGRADE PYELOGRAM, LEFT URETEROSCOPY AND LEFT URETERAL STENT PLACEMENT;  Surgeon: Malka So, MD;  Location: [facility];  Service: Urology;  Laterality: Left;  . HOLMIUM LASER APPLICATION Left 05/06/8114   Procedure: HOLMIUM LASER APPLICATION;  Surgeon: Malka So, MD;  Location: [facility];  Service: Urology;  Laterality: Left;  . LITHOTRIPSY  2012   for kidney stones x 3  . LITHOTRIPSY     3 WITHIN PAST 18 MONTHS  . lower Rt parathyroid gland removed  20012  . Rt hand trigger finger  2008    Family History  Problem Relation Age of Onset  . Colon cancer Paternal Uncle 29  . Colon cancer Maternal Grandfather 20  . Stomach cancer Neg Hx    Social History:  reports that she has never smoked. She has never used smokeless tobacco. She reports that she does not drink alcohol or use drugs.  Allergies:  Allergies  Allergen Reactions  . Penicillins Rash    No prescriptions prior to admission.    No results found for this or any previous visit (from the past 48 hour(s)). No results found.  Review of Systems  Constitutional: Negative.  Negative for chills and fever.   HENT: Negative.   Eyes: Negative.   Respiratory: Negative.   Cardiovascular: Negative.   Gastrointestinal: Negative.   Genitourinary: Negative.   Musculoskeletal: Negative.   Skin: Negative.   Neurological: Negative.   Endo/Heme/Allergies: Negative.     There were no vitals taken for this visit. Physical Exam  Constitutional: She is oriented to person, place, and time. She appears well-developed.  HENT:  Head: Normocephalic.  Eyes: Pupils are equal, round, and reactive to light.  Neck: Normal range of motion.  Cardiovascular: Normal rate.   Respiratory: Effort normal.  GI: Soft.  Genitourinary:  Genitourinary Comments: Mild Rt CVAT  Musculoskeletal: Normal range of motion.  Neurological: She is alert and oriented to person, place, and time.  Skin: Skin is warm.  Psychiatric: She has a normal mood and affect.     Assessment/Plan  1 - RIGHT Proximal Ureteral Stone - proceed as planned with RIGHT shockwave lithotrispy. Risks, benefits, alternatives, expected peri-procedure course discussed previously and reiterated today.   Alexis Frock, MD 02/24/2017, 6:43 AM

## 2017-02-24 NOTE — Brief Op Note (Signed)
02/24/2017  11:06 AM  PATIENT:  Colon Flattery  61 y.o. female  PRE-OPERATIVE DIAGNOSIS:  RIGHT URETEROPELVIC JUNCTION STONE  POST-OPERATIVE DIAGNOSIS:  * No post-op diagnosis entered *  PROCEDURE:  Procedure(s): RIGHT EXTRACORPOREAL SHOCK WAVE LITHOTRIPSY (ESWL) (Right)  SURGEON:  Surgeon(s) and Role:    * Alexis Frock, MD - Primary  PHYSICIAN ASSISTANT:   ASSISTANTS: none   ANESTHESIA:   MAC  EBL:  No intake/output data recorded.  BLOOD ADMINISTERED:none  DRAINS: none   LOCAL MEDICATIONS USED:  NONE  SPECIMEN:  No Specimen  DISPOSITION OF SPECIMEN:  N/A  COUNTS:  YES  TOURNIQUET:  * No tourniquets in log *  DICTATION: .Note written in paper chart  PLAN OF CARE: Discharge to home after PACU  PATIENT DISPOSITION:  Short Stay   Delay start of Pharmacological VTE agent (>24hrs) due to surgical blood loss or risk of bleeding: yes

## 2017-02-25 ENCOUNTER — Encounter (HOSPITAL_COMMUNITY): Payer: Self-pay | Admitting: Urology

## 2017-03-23 ENCOUNTER — Other Ambulatory Visit: Payer: Self-pay | Admitting: Urology

## 2017-03-24 NOTE — Patient Instructions (Addendum)
RAYLEEN WYRICK  03/24/2017   Your procedure is scheduled on: 03/31/17  Report to [facility] Main  Entrance Take [place]  elevators to 3rd floor to  [facility] at     1000AM.    Call this number if you have problems the morning of surgery [phone]    Remember: ONLY 1 PERSON MAY GO WITH YOU TO SHORT STAY TO GET  READY MORNING OF YOUR SURGERY.  Do not eat food or drink liquids :After Midnight.     Take these medicines the morning of surgery with A SIP OF WATER: none                                You may not have any metal on your body including hair pins and              piercings  Do not wear jewelry, make-up, lotions, powders or perfumes, deodorant             Do not wear nail polish.  Do not shave  48 hours prior to surgery.     Do not bring valuables to the hospital. [place].  Contacts, dentures or bridgework may not be worn into surgery.       Patients discharged the day of surgery will not be allowed to drive home.  Name and phone number of your driver:  Special Instructions: N/A              Please read over the following fact sheets you were given: _____________________________________________________________________             [facility] - Preparing for Surgery Before surgery, you can play an important role.  Because skin is not sterile, your skin needs to be as free of germs as possible.  You can reduce the number of germs on your skin by washing with CHG (chlorahexidine gluconate) soap before surgery.  CHG is an antiseptic cleaner which kills germs and bonds with the skin to continue killing germs even after washing. Please DO NOT use if you have an allergy to CHG or antibacterial soaps.  If your skin becomes reddened/irritated stop using the CHG and inform your nurse when you arrive at Short Stay. Do not shave (including legs and underarms) for at least 48 hours prior to the first  CHG shower.  You may shave your face/neck. Please follow these instructions carefully:  1.  Shower with CHG Soap the night before surgery and the  morning of Surgery.  2.  If you choose to wash your hair, wash your hair first as usual with your  normal  shampoo.  3.  After you shampoo, rinse your hair and body thoroughly to remove the  shampoo.                           4.  Use CHG as you would any other liquid soap.  You can apply chg directly  to the skin and wash                       Gently with a scrungie or clean washcloth.  5.  Apply the CHG  Soap to your body ONLY FROM THE NECK DOWN.   Do not use on face/ open                           Wound or open sores. Avoid contact with eyes, ears mouth and genitals (private parts).                       Wash face,  Genitals (private parts) with your normal soap.             6.  Wash thoroughly, paying special attention to the area where your surgery  will be performed.  7.  Thoroughly rinse your body with warm water from the neck down.  8.  DO NOT shower/wash with your normal soap after using and rinsing off  the CHG Soap.                9.  Pat yourself dry with a clean towel.            10.  Wear clean pajamas.            11.  Place clean sheets on your bed the night of your first shower and do not  sleep with pets. Day of Surgery : Do not apply any lotions/deodorants the morning of surgery.  Please wear clean clothes to the hospital/surgery center.  FAILURE TO FOLLOW THESE INSTRUCTIONS MAY RESULT IN THE CANCELLATION OF YOUR SURGERY PATIENT SIGNATURE_________________________________  NURSE SIGNATURE__________________________________  ________________________________________________________________________

## 2017-03-29 ENCOUNTER — Encounter (HOSPITAL_COMMUNITY)
Admission: RE | Admit: 2017-03-29 | Discharge: 2017-03-29 | Disposition: A | Discharge status: Home / Self Care | Payer: Managed Care, Other (non HMO) | Source: Ambulatory Visit | Attending: Urology | Admitting: Urology

## 2017-03-29 ENCOUNTER — Encounter (HOSPITAL_COMMUNITY): Payer: Self-pay

## 2017-03-29 ENCOUNTER — Encounter (INDEPENDENT_AMBULATORY_CARE_PROVIDER_SITE_OTHER): Payer: Self-pay

## 2017-03-29 DIAGNOSIS — Z6832 Body mass index (BMI) 32.0-32.9, adult: Secondary | ICD-10-CM | POA: Diagnosis not present

## 2017-03-29 DIAGNOSIS — M5410 Radiculopathy, site unspecified: Secondary | ICD-10-CM | POA: Diagnosis not present

## 2017-03-29 DIAGNOSIS — N281 Cyst of kidney, acquired: Secondary | ICD-10-CM | POA: Diagnosis not present

## 2017-03-29 DIAGNOSIS — R51 Headache: Secondary | ICD-10-CM | POA: Diagnosis not present

## 2017-03-29 DIAGNOSIS — N132 Hydronephrosis with renal and ureteral calculous obstruction: Secondary | ICD-10-CM | POA: Diagnosis not present

## 2017-03-29 DIAGNOSIS — Z87442 Personal history of urinary calculi: Secondary | ICD-10-CM | POA: Diagnosis not present

## 2017-03-29 DIAGNOSIS — E669 Obesity, unspecified: Secondary | ICD-10-CM | POA: Diagnosis not present

## 2017-03-29 DIAGNOSIS — I10 Essential (primary) hypertension: Secondary | ICD-10-CM | POA: Diagnosis not present

## 2017-03-29 DIAGNOSIS — Z880 Allergy status to penicillin: Secondary | ICD-10-CM | POA: Diagnosis not present

## 2017-03-29 DIAGNOSIS — N202 Calculus of kidney with calculus of ureter: Secondary | ICD-10-CM | POA: Diagnosis present

## 2017-03-29 DIAGNOSIS — Z79899 Other long term (current) drug therapy: Secondary | ICD-10-CM | POA: Diagnosis not present

## 2017-03-29 NOTE — Progress Notes (Signed)
Spoke with Dr. Royce Macadamia anesthesia concerning pt. Still taking phentermine and surgery is on 03/31/17 . Dr. Karie Georges that is ok not to have held this  Prior to procedure.

## 2017-03-29 NOTE — Progress Notes (Signed)
Pt. Refused to get labs drawn at preop . She works for [employer] and wants them drawn there . She spoke with connie while at preop appt. To see if Dr. Jeffie Pollock would fax order to [place] labs. Marlowe Kays said Dr. Jeffie Pollock in surgery today she would call pt. Pt. Informed we need these labs prior to surgery on 5/31. PT. To drop labs off or have them faxed to me.

## 2017-03-30 NOTE — Progress Notes (Addendum)
CBC BMP from [facility] on chart.done 03/29/17 Final EKG done 03/29/17 in epic

## 2017-03-31 ENCOUNTER — Ambulatory Visit (HOSPITAL_COMMUNITY)
Admission: RE | Admit: 2017-03-31 | Discharge: 2017-03-31 | Disposition: A | Discharge status: Home / Self Care | Payer: Managed Care, Other (non HMO) | Source: Ambulatory Visit | Attending: Urology | Admitting: Urology

## 2017-03-31 ENCOUNTER — Encounter (HOSPITAL_COMMUNITY)
Admission: RE | Disposition: A | Discharge status: Home / Self Care | Payer: Self-pay | Source: Ambulatory Visit | Attending: Urology

## 2017-03-31 ENCOUNTER — Encounter (HOSPITAL_COMMUNITY): Payer: Self-pay | Admitting: *Deleted

## 2017-03-31 ENCOUNTER — Ambulatory Visit (HOSPITAL_COMMUNITY): Payer: Managed Care, Other (non HMO)

## 2017-03-31 ENCOUNTER — Ambulatory Visit (HOSPITAL_COMMUNITY): Payer: Managed Care, Other (non HMO) | Admitting: Anesthesiology

## 2017-03-31 DIAGNOSIS — E669 Obesity, unspecified: Secondary | ICD-10-CM | POA: Insufficient documentation

## 2017-03-31 DIAGNOSIS — Z6832 Body mass index (BMI) 32.0-32.9, adult: Secondary | ICD-10-CM | POA: Insufficient documentation

## 2017-03-31 DIAGNOSIS — Z79899 Other long term (current) drug therapy: Secondary | ICD-10-CM | POA: Insufficient documentation

## 2017-03-31 DIAGNOSIS — N132 Hydronephrosis with renal and ureteral calculous obstruction: Secondary | ICD-10-CM | POA: Insufficient documentation

## 2017-03-31 DIAGNOSIS — Z87442 Personal history of urinary calculi: Secondary | ICD-10-CM | POA: Insufficient documentation

## 2017-03-31 DIAGNOSIS — M5410 Radiculopathy, site unspecified: Secondary | ICD-10-CM | POA: Insufficient documentation

## 2017-03-31 DIAGNOSIS — Z880 Allergy status to penicillin: Secondary | ICD-10-CM | POA: Insufficient documentation

## 2017-03-31 DIAGNOSIS — N281 Cyst of kidney, acquired: Secondary | ICD-10-CM | POA: Insufficient documentation

## 2017-03-31 DIAGNOSIS — R51 Headache: Secondary | ICD-10-CM | POA: Insufficient documentation

## 2017-03-31 DIAGNOSIS — I10 Essential (primary) hypertension: Secondary | ICD-10-CM | POA: Insufficient documentation

## 2017-03-31 HISTORY — PX: CYSTOSCOPY WITH RETROGRADE PYELOGRAM, URETEROSCOPY AND STENT PLACEMENT: SHX5789

## 2017-03-31 HISTORY — PX: HOLMIUM LASER APPLICATION: SHX5852

## 2017-03-31 SURGERY — CYSTOURETEROSCOPY, WITH RETROGRADE PYELOGRAM AND STENT INSERTION
Anesthesia: General | Site: Ureter | Laterality: Right

## 2017-03-31 MED ORDER — KETOROLAC TROMETHAMINE 30 MG/ML IJ SOLN
INTRAMUSCULAR | Status: DC | PRN
  Administered 2017-03-31: 30 mg via INTRAVENOUS

## 2017-03-31 MED ORDER — SODIUM CHLORIDE 0.9 % IV SOLN: 250.0000 mL | INTRAVENOUS | Status: DC | PRN

## 2017-03-31 MED ORDER — MIDAZOLAM HCL 5 MG/5ML IJ SOLN
INTRAMUSCULAR | Status: DC | PRN
  Administered 2017-03-31: 2 mg via INTRAVENOUS

## 2017-03-31 MED ORDER — ACETAMINOPHEN 650 MG RE SUPP
650.0000 mg | RECTAL | Status: DC | PRN
  Filled 2017-03-31: qty 1

## 2017-03-31 MED ORDER — LIDOCAINE 2% (20 MG/ML) 5 ML SYRINGE
INTRAMUSCULAR | Status: DC | PRN
  Administered 2017-03-31: 100 mg via INTRAVENOUS

## 2017-03-31 MED ORDER — FENTANYL CITRATE (PF) 100 MCG/2ML IJ SOLN
INTRAMUSCULAR | Status: DC | PRN
  Administered 2017-03-31 (×2): 25 ug via INTRAVENOUS
  Administered 2017-03-31: 50 ug via INTRAVENOUS

## 2017-03-31 MED ORDER — PROPOFOL 10 MG/ML IV BOLUS
INTRAVENOUS | Status: AC
  Filled 2017-03-31: qty 20

## 2017-03-31 MED ORDER — PROPOFOL 10 MG/ML IV BOLUS
INTRAVENOUS | Status: DC | PRN
  Administered 2017-03-31: 170 mg via INTRAVENOUS

## 2017-03-31 MED ORDER — PROMETHAZINE HCL 25 MG/ML IJ SOLN: 6.2500 mg | INTRAMUSCULAR | Status: DC | PRN

## 2017-03-31 MED ORDER — ONDANSETRON HCL 4 MG/2ML IJ SOLN
INTRAMUSCULAR | Status: AC
  Filled 2017-03-31: qty 2

## 2017-03-31 MED ORDER — SODIUM CHLORIDE 0.9 % IR SOLN
Status: DC | PRN
  Administered 2017-03-31: 1000 mL

## 2017-03-31 MED ORDER — MORPHINE SULFATE (PF) 4 MG/ML IV SOLN: 2.0000 mg | INTRAVENOUS | Status: DC | PRN

## 2017-03-31 MED ORDER — FENTANYL CITRATE (PF) 100 MCG/2ML IJ SOLN
INTRAMUSCULAR | Status: AC
  Filled 2017-03-31: qty 2

## 2017-03-31 MED ORDER — KETOROLAC TROMETHAMINE 30 MG/ML IJ SOLN
INTRAMUSCULAR | Status: AC
  Filled 2017-03-31: qty 2

## 2017-03-31 MED ORDER — OXYCODONE-ACETAMINOPHEN 5-325 MG PO TABS: 1.0000 | ORAL_TABLET | Freq: Four times a day (QID) | ORAL | 0 refills | Status: DC | PRN

## 2017-03-31 MED ORDER — LACTATED RINGERS IV SOLN
INTRAVENOUS | Status: DC
  Administered 2017-03-31 (×2): via INTRAVENOUS

## 2017-03-31 MED ORDER — ONDANSETRON HCL 4 MG/2ML IJ SOLN
INTRAMUSCULAR | Status: DC | PRN
  Administered 2017-03-31: 4 mg via INTRAVENOUS

## 2017-03-31 MED ORDER — SODIUM CHLORIDE 0.9% FLUSH: 3.0000 mL | INTRAVENOUS | Status: DC | PRN

## 2017-03-31 MED ORDER — LIDOCAINE 2% (20 MG/ML) 5 ML SYRINGE
INTRAMUSCULAR | Status: AC
  Filled 2017-03-31: qty 5

## 2017-03-31 MED ORDER — ACETAMINOPHEN 325 MG PO TABS: 650.0000 mg | ORAL_TABLET | ORAL | Status: DC | PRN

## 2017-03-31 MED ORDER — OXYCODONE HCL 5 MG PO TABS: 5.0000 mg | ORAL_TABLET | Freq: Once | ORAL | Status: DC | PRN

## 2017-03-31 MED ORDER — SODIUM CHLORIDE 0.9% FLUSH: 3.0000 mL | Freq: Two times a day (BID) | INTRAVENOUS | Status: DC

## 2017-03-31 MED ORDER — CIPROFLOXACIN IN D5W 400 MG/200ML IV SOLN
400.0000 mg | INTRAVENOUS | Status: AC
  Administered 2017-03-31: 400 mg via INTRAVENOUS
  Filled 2017-03-31: qty 200

## 2017-03-31 MED ORDER — MIDAZOLAM HCL 2 MG/2ML IJ SOLN
INTRAMUSCULAR | Status: AC
  Filled 2017-03-31: qty 2

## 2017-03-31 MED ORDER — OXYCODONE HCL 5 MG PO TABS: 5.0000 mg | ORAL_TABLET | ORAL | Status: DC | PRN

## 2017-03-31 MED ORDER — SODIUM CHLORIDE 0.9 % IR SOLN
Status: DC | PRN
  Administered 2017-03-31: 6000 mL

## 2017-03-31 MED ORDER — DEXAMETHASONE SODIUM PHOSPHATE 10 MG/ML IJ SOLN
INTRAMUSCULAR | Status: AC
  Filled 2017-03-31: qty 1

## 2017-03-31 MED ORDER — DEXAMETHASONE SODIUM PHOSPHATE 10 MG/ML IJ SOLN
INTRAMUSCULAR | Status: DC | PRN
  Administered 2017-03-31: 10 mg via INTRAVENOUS

## 2017-03-31 MED ORDER — OXYCODONE HCL 5 MG/5ML PO SOLN: 5.0000 mg | Freq: Once | ORAL | Status: DC | PRN

## 2017-03-31 MED ORDER — HYDROMORPHONE HCL 1 MG/ML IJ SOLN: 0.2500 mg | INTRAMUSCULAR | Status: DC | PRN

## 2017-03-31 MED ORDER — IOHEXOL 300 MG/ML  SOLN
INTRAMUSCULAR | Status: DC | PRN
  Administered 2017-03-31: 7 mL

## 2017-03-31 SURGICAL SUPPLY — 23 items
BAG URO CATCHER STRL LF (MISCELLANEOUS) ×2 IMPLANT
BASKET STONE NCOMPASS (UROLOGICAL SUPPLIES) IMPLANT
CATH URET 5FR 28IN OPEN ENDED (CATHETERS) ×2 IMPLANT
CATH URET DUAL LUMEN 6-10FR 50 (CATHETERS) IMPLANT
CLOTH BEACON ORANGE TIMEOUT ST (SAFETY) ×2 IMPLANT
COVER SURGICAL LIGHT HANDLE (MISCELLANEOUS) ×2 IMPLANT
EXTRACTOR STONE NITINOL NGAGE (UROLOGICAL SUPPLIES) ×2 IMPLANT
FIBER LASER FLEXIVA 1000 (UROLOGICAL SUPPLIES) IMPLANT
FIBER LASER FLEXIVA 365 (UROLOGICAL SUPPLIES) ×2 IMPLANT
FIBER LASER FLEXIVA 550 (UROLOGICAL SUPPLIES) IMPLANT
FIBER LASER TRAC TIP (UROLOGICAL SUPPLIES) IMPLANT
GLOVE SURG SS PI 8.0 STRL IVOR (GLOVE) ×2 IMPLANT
GOWN STRL REUS W/TWL XL LVL3 (GOWN DISPOSABLE) ×2 IMPLANT
GUIDEWIRE STR DUAL SENSOR (WIRE) ×2 IMPLANT
IV NS 1000ML (IV SOLUTION) ×1
IV NS 1000ML BAXH (IV SOLUTION) ×1 IMPLANT
IV NS IRRIG 3000ML ARTHROMATIC (IV SOLUTION) ×4 IMPLANT
MANIFOLD NEPTUNE II (INSTRUMENTS) ×2 IMPLANT
PACK CYSTO (CUSTOM PROCEDURE TRAY) ×2 IMPLANT
SHEATH ACCESS URETERAL 38CM (SHEATH) ×2 IMPLANT
SHEATH URET ACCESS 10/12FR (MISCELLANEOUS) IMPLANT
STENT URET 6FRX26 CONTOUR (STENTS) ×2 IMPLANT
TUBING CONNECTING 10 (TUBING) ×2 IMPLANT

## 2017-03-31 NOTE — Anesthesia Preprocedure Evaluation (Addendum)
Anesthesia Evaluation  Patient identified by MRN, date of birth, ID band Patient awake    Reviewed: Allergy & Precautions, H&P , NPO status , Patient's Chart, lab work & pertinent test results  Airway Mallampati: II  TM Distance: <3 FB Neck ROM: Full    Dental no notable dental hx.    Pulmonary neg pulmonary ROS,    Pulmonary exam normal breath sounds clear to auscultation       Cardiovascular hypertension, Pt. on medications Normal cardiovascular exam Rhythm:Regular Rate:Normal  ECG: NSR, rate 76   Neuro/Psych  Headaches, negative psych ROS   GI/Hepatic negative GI ROS, Neg liver ROS,   Endo/Other  negative endocrine ROS  Renal/GU negative Renal ROS  negative genitourinary   Musculoskeletal negative musculoskeletal ROS (+)   Abdominal   Peds negative pediatric ROS (+)  Hematology negative hematology ROS (+)   Anesthesia Other Findings Obese, BMI 32  Reproductive/Obstetrics negative OB ROS                             Anesthesia Physical  Anesthesia Plan  ASA: I  Anesthesia Plan: General   Post-op Pain Management:    Induction: Intravenous  Airway Management Planned: LMA  Additional Equipment:   Intra-op Plan:   Post-operative Plan:   Informed Consent: I have reviewed the patients History and Physical, chart, labs and discussed the procedure including the risks, benefits and alternatives for the proposed anesthesia with the patient or authorized representative who has indicated his/her understanding and acceptance.   Dental advisory given  Plan Discussed with: CRNA and Surgeon  Anesthesia Plan Comments:         Anesthesia Quick Evaluation

## 2017-03-31 NOTE — Transfer of Care (Signed)
Immediate Anesthesia Transfer of Care Note  Patient: Janice Guerra  Procedure(s) Performed: Procedure(s): CYSTOSCOPY WITH RIGHT  RETROGRADE PYELOGRAM, URETEROSCOPY HOLMIUM LASER  AND STENT PLACEMENT (Right) HOLMIUM LASER APPLICATION (Right)  Patient Location: PACU  Anesthesia Type:General  Level of Consciousness:  sedated, patient cooperative and responds to stimulation  Airway & Oxygen Therapy:Patient Spontanous Breathing and Patient connected to face mask oxgen  Post-op Assessment:  Report given to PACU RN and Post -op Vital signs reviewed and stable  Post vital signs:  Reviewed and stable  Last Vitals:  Vitals:   03/31/17 1025  BP: 128/80  Pulse: 70  Resp: 18  Temp: 28.0 C    Complications: No apparent anesthesia complications

## 2017-03-31 NOTE — Brief Op Note (Signed)
03/31/2017  1:15 PM  PATIENT:  Janice Guerra  61 y.o. female  PRE-OPERATIVE DIAGNOSIS:  RIGHT PROXIMAL URETERALAND RENAL STONES  POST-OPERATIVE DIAGNOSIS:  right proximal and ureteral and renal  PROCEDURE:  Procedure(s): CYSTOSCOPY WITH RIGHT  RETROGRADE PYELOGRAM, URETEROSCOPY HOLMIUM LASER  AND STENT PLACEMENT (Right) RIGHT PROXIMAL URETERAL STONE HOLMIUM LASER APPLICATION (Right) URETEROSCOPY WITH BASKETING OF RIGHT UPPER AND LOWER POLE RENAL STONES  SURGEON:  Surgeon(s) and Role:    Irine Seal, MD - Primary  PHYSICIAN ASSISTANT:   ASSISTANTS: none   ANESTHESIA:   general  EBL:  Total I/O In: 1000 [I.V.:1000] Out: 0   BLOOD ADMINISTERED:none  DRAINS: 6 x 26 JJ with tether, right   LOCAL MEDICATIONS USED:  NONE  SPECIMEN:  Source of Specimen:  stone fragments  DISPOSITION OF SPECIMEN:  to patient  COUNTS:  YES  TOURNIQUET:  * No tourniquets in log *  DICTATION: .Other Dictation: Dictation Number [id number]  PLAN OF CARE: Discharge to home after PACU  PATIENT DISPOSITION:  PACU - hemodynamically stable.   Delay start of Pharmacological VTE agent (>24hrs) due to surgical blood loss or risk of bleeding: not applicable

## 2017-03-31 NOTE — Discharge Instructions (Signed)

## 2017-03-31 NOTE — Anesthesia Postprocedure Evaluation (Signed)
Anesthesia Post Note  Patient: Janice Guerra  Procedure(s) Performed: Procedure(s) (LRB): CYSTOSCOPY WITH RIGHT  RETROGRADE PYELOGRAM, URETEROSCOPY HOLMIUM LASER  AND STENT PLACEMENT (Right) HOLMIUM LASER APPLICATION (Right)     Patient location during evaluation: PACU Anesthesia Type: General Level of consciousness: awake and alert Pain management: pain level controlled Vital Signs Assessment: post-procedure vital signs reviewed and stable Respiratory status: spontaneous breathing, nonlabored ventilation, respiratory function stable and patient connected to nasal cannula oxygen Cardiovascular status: blood pressure returned to baseline and stable Postop Assessment: no signs of nausea or vomiting Anesthetic complications: no    Last Vitals:  Vitals:   03/31/17 1402 03/31/17 1444  BP: 126/75 111/90  Pulse: (!) 56 62  Resp: 13 16  Temp: 36.5 C 36.4 C    Last Pain:  Vitals:   03/31/17 1444  TempSrc: Oral  PainSc:                  Taeveon Keesling P Lillieanna Tuohy

## 2017-03-31 NOTE — Anesthesia Procedure Notes (Signed)
Procedure Name: LMA Insertion Date/Time: 03/31/2017 12:06 PM Performed by: Talbot Grumbling Pre-anesthesia Checklist: Patient identified, Emergency Drugs available, Suction available and Patient being monitored Patient Re-evaluated:Patient Re-evaluated prior to inductionOxygen Delivery Method: Circle system utilized Preoxygenation: Pre-oxygenation with 100% oxygen Intubation Type: IV induction Ventilation: Mask ventilation without difficulty LMA: LMA inserted LMA Size: 4.0 Number of attempts: 1 Placement Confirmation: positive ETCO2 and breath sounds checked- equal and bilateral Tube secured with: Tape Dental Injury: Teeth and Oropharynx as per pre-operative assessment

## 2017-03-31 NOTE — H&P (Signed)
CC: I have kidney stones.  HPI: Janice Guerra is a 61 year-old female established patient who is here for renal calculi.  The problem is on the right side. This is not her first kidney stone. She is not currently having flank pain, back pain, groin pain, nausea, vomiting, fever or chills. She has not caught a stone in her urine strainer since her symptoms began.   She has had eswl, ureteral stent, and ureteroscopy for treatment of her stones in the past.   Janice Guerra returns today in f/u for her stones. She had ESWL on 4/26 for a right proximal stone. She has passed a small fragment but a KUB today still shows a 12.5 x 56mm right proximal stone and a 4 mm renal stone. She has hydro on [place]. She has had no pain or hematuria in the last 2 weeks but she has had some mild burning and increased nocturia x 2. She last had a left ureteroscopic stone extraction in 6/14. She is on HCTZ. She has had prior hyperparathyroidism that caused her stones but is s/p parathyroidectomy. She has had several lithotripsies and endoscopic procedures for her stones. She has a RLP complex renal cyst that was felt to be benign on MRI in June 2013.       ALLERGIES: Penicillins    MEDICATIONS: Lisinopril 10 mg tablet  Tamsulosin Hcl 0.4 mg capsule, ext release 24 hr 1 capsule PO Daily  Urocit-K 15 meq (1,620 mg) tablet, extended release 1 tablet PO Daily  HydroCHLOROthiazide 12.5 MG Oral Capsule 1 Oral Daily  Lotemax 0.5 % suspension, drops Ophthalmic  Oxycodone Hcl 5 mg tablet 1 tablet PO Q 6 H PRN  Restasis 0.05 % dropperette, single-use drop dispenser Ophthalmic  Vivelle-Dot 0.05 mg/24 hour patch, transdermal semiweekly Transdermal     GU PSH: Cysto Uretero Lithotripsy - 2014 Cystoscopy Insert Stent - 2014, 2010 Cystoscopy Ureteroscopy - 2010 ESWL - 02/24/2017, 2012, 2012 Hysterectomy Unilat SO - 2010      PSH Notes: Cystoscopy With Ureteroscopy With Lithotripsy, Cystoscopy With Insertion Of Ureteral Stent Left,  Nerve Block Transforaminal Epidural, Lithotripsy, Parathyroid Surgery, Lithotripsy, Cystoscopy With Insertion Of Ureteral Stent Right, Cystoscopy With Ureteroscopy Right, Hysterectomy   NON-GU PSH: No Non-GU PSH    GU PMH: Renal calculus (Worsening), Right, Possible new RLP stone. - 09/02/2016, Nephrolithiasis, - 06/19/2015 Stress Incontinence, Female stress incontinence - 06/19/2015 Hydronephrosis Unspec, Hydronephrosis, left - 2015, Hydronephrosis, - 2014 Ureteral calculus, Calculus of left ureter - 2015, Proximal Ureteral Stone On The Right, - 2014, Ureteral Stone, - 2014 Flank Pain, Diffuse Abdominal Pain - 2014 Gross hematuria, Gross Hematuria - 2014 History of urolithiasis, Nephrolithiasis - 2014 Renal cyst, Renal cyst, acquired - 2014 Urinary incontinence, Unspec, Urinary incontinence - 2014 Urinary Tract Inf, Unspec site, Pyuria - 2014    NON-GU PMH: Personal history of other specified conditions, History of edema - 06/19/2015 Encounter for general adult medical examination without abnormal findings, Encounter for preventive health examination - 2015 Personal history of other endocrine, nutritional and metabolic disease, History of hyperparathyroidism - 2014 Radiculopathy, lumbar region, Lumbar Radiculopathy - 2014    FAMILY HISTORY: Heart Disease - Father nephrolithiasis - Mother, Sister, Father   SOCIAL HISTORY: Marital Status: Married     Notes: Caffeine Use, Previous History Of Smoking, Marital History - Currently Married, Occupation:   REVIEW OF SYSTEMS:    GU Review Female:   Patient reports get up at night to urinate. Patient denies frequent urination, hard to postpone urination, burning Janice Guerra  with urination, leakage of urine, stream starts and stops, trouble starting your stream, have to strain to urinate, and being pregnant.  Gastrointestinal (Upper):   Patient denies nausea, vomiting, and indigestion/ heartburn.  Gastrointestinal (Lower):   Patient denies diarrhea and  constipation.  Constitutional:   Patient denies fever, night sweats, weight loss, and fatigue.  Skin:   Patient denies skin rash/ lesion and itching.  Eyes:   Patient denies blurred vision and double vision.  Ears/ Nose/ Throat:   Patient denies sore throat and sinus problems.  Hematologic/Lymphatic:   Patient denies swollen glands and easy bruising.  Cardiovascular:   Patient denies leg swelling and chest pains.  Respiratory:   Patient denies cough and shortness of breath.  Endocrine:   Patient denies excessive thirst.  Musculoskeletal:   Patient denies back pain and joint pain.  Neurological:   Patient denies dizziness and headaches.  Psychologic:   Patient denies depression and anxiety.   VITAL SIGNS:      03/23/2017 02:43 PM  BP 123/75 mmHg  Pulse 84 /min  Temperature 98.1 F / 37 C   MULTI-SYSTEM PHYSICAL EXAMINATION:    Constitutional: Well-nourished. No physical deformities. Normally developed. Good grooming.  Respiratory: No labored breathing, no use of accessory muscles. CTA  Cardiovascular: Normal temperature, RRR without murmur.     PAST DATA REVIEWED:  Source Of History:  Patient  X-Ray Review: KUB: Reviewed Films. Discussed With Patient.  Renal Ultrasound: Reviewed Films. Discussed With Patient.     PROCEDURES:         Renal Ultrasound (Limited) - 26834  RT Kidney: Length:12.6 cm Depth:5.4 cm Cortical Width: 1.5 cm Width: 5.4 cm    Right Kidney/Ureter:  Hydro noted, Multiple stones seen 1) MP= 0.61cm 2) MP= 1.1cm 3) LP= 0.51cm 4) LP=0.62cm, Cystic area with calc LP= 1.1x1.1x1.1cm  Bladder:  Not seen.               KUB - [id number]  A single view of the abdomen is obtained. KUB shows no change in the 12x64mm right proximal stone and right renal stone. There is a new 70mm shadow over the renal pelvis but it is probably bowel content. No bone, gas or soft tissue abnormalities are noted.                Urinalysis Dipstick Dipstick Cont'd  Color: Yellow  Bilirubin: Neg  Appearance: Clear Ketones: Neg  Specific Gravity: 1.015 Blood: Neg  pH: 7.0 Protein: Neg  Glucose: Neg Urobilinogen: 0.2    Nitrites: Neg    Leukocyte Esterase: Neg    ASSESSMENT:      ICD-10 Details  1 GU:   Ureteral calculus - N20.1 She had minimal response to the right ESWL and now needs ureteroscopy for the ureteral and renal stones. I have reviewed the risks of bleeding, infection, ureteral injury, need for a stent or secondary procedure, thrombotic events and anesthetic complicaitons.   2   Renal calculus - N20.0    PLAN:           Schedule Return Visit/Planned Activity: Next Available Appointment - Schedule Surgery

## 2017-04-01 ENCOUNTER — Encounter (HOSPITAL_COMMUNITY): Payer: Self-pay | Admitting: Urology

## 2017-04-01 NOTE — Op Note (Signed)
NAME:  Janice Guerra, Janice Guerra NO.:  MEDICAL RECORD NO.:  [id number]  LOCATION:                                 FACILITY:  PHYSICIAN:  Marshall Cork. Jeffie Pollock, M.D.    DATE OF BIRTH:  07/06/1956  DATE OF PROCEDURE:  03/31/2017 DATE OF DISCHARGE:                              OPERATIVE REPORT   PROCEDURE: 1. Cystoscopy with right retrograde pyelogram interpretation. 2. Right ureteroscopic stone extraction with holmium lasertripsy and     placement of right double-J stent for right proximal ureteral     stone. 3. Right ureteroscopy with stone basketing for right renal stones.  PREOPERATIVE DIAGNOSIS:  Right proximal ureteral and renal stones.  POSTOPERATIVE DIAGNOSIS:  Right proximal ureteral and renal stones.  SURGEON:  Marshall Cork. Jeffie Pollock, M.D.  ANESTHESIA:  General.  SPECIMEN:  Stone fragments.  DRAINS:  A 6-French x 26 cm Contour double-J stent with tether.  BLOOD LOSS:  Minimal.  COMPLICATIONS:  None.  INDICATIONS:  Ms. Collar is a 61 year old African American female with a history of recurrent urolithiasis.  She had an approximately 8 mm right proximal ureteral stone that was initially treated with lithotripsy, but did not fragment.  After reviewing the options, she has elected to undergo ureteroscopy.  FINDINGS AND PROCEDURE:  She was given Cipro.  She was taken to the operating room where general anesthetic was induced.  She was placed in lithotomy position.  Her perineum and genitalia were prepped with Betadine solution.  She was draped in the usual sterile fashion.  Cystoscopy was performed using the 23-French scope and 30-degree lens. Examination revealed normal urethra.  The bladder wall had mild trabeculation.  No tumors, stones, or inflammation were noted.  Ureteral orifices were unremarkable.  The right ureteral orifice was cannulated with a 5-French open-end catheter and contrast was instilled.  This revealed a normal ureter up to the level of the  stone which was in the proximal ureter.  There was a filling defect at the level of stone and proximal dilation.  She did have some calcifications over the kidney as well.  Once retrograde pyelogram was complete, a sensor guidewire was passed to the kidney and the open-end catheter was removed.  A 38 cm digital access sheath 12-French inner core was then passed over the wire to dilate the ureter to the level of stone.  This passed easily.  A 6.5-French semi-rigid ureteroscope was then passed and a 365 micron laser fiber was then inserted, it was initially set at 0.5 watts and 10 hertz but the power was increased to 1 watt as needed.  The stone was broken into manageable fragments which were then removed with an N-Gage basket.  Once the proximal ureteral stone had been completely removed, the ureteroscope was removed and the digital access sheath was assembled and inserted over the wire to the level of the kidney.  The wire and inner core were removed, and a dual-lumen digital flexible ureteroscope was then passed to the kidney.  Multiple passes were made and several small stones were removed from the upper, mid, and lower pole of the kidney.  Once the patient was  felt to be stone free, the ureteroscope was removed, a guidewire was placed back through the access sheath, and the access sheath was removed.  The cystoscope was reinserted over the guidewire, a 6-French 26 cm Contour double-J stent with tether was passed to the kidney under fluoroscopic guidance, the wire was removed leaving good coil in the kidney and good coil in the bladder.  The bladder was then drained.  The cystoscope was removed leaving the stent string exiting the urethra.  The string was tied near the meatus and trimmed to an appropriate length and tucked vaginally.  The patient was then taken down from lithotomy position.  Her anesthetic was reversed.  She was moved to recovery room in stable condition.   There were no complications.     Marshall Cork. Jeffie Pollock, M.D.     JJW/MEDQ  D:  03/31/2017  T:  04/01/2017  Job:  [id number]

## 2017-08-29 ENCOUNTER — Other Ambulatory Visit: Payer: Self-pay | Admitting: Obstetrics & Gynecology

## 2017-08-29 DIAGNOSIS — Z1231 Encounter for screening mammogram for malignant neoplasm of breast: Secondary | ICD-10-CM

## 2017-10-13 ENCOUNTER — Ambulatory Visit
Admission: RE | Admit: 2017-10-13 | Discharge: 2017-10-13 | Disposition: A | Discharge status: Home / Self Care | Payer: Managed Care, Other (non HMO) | Source: Ambulatory Visit | Attending: Obstetrics & Gynecology | Admitting: Obstetrics & Gynecology

## 2017-10-13 DIAGNOSIS — Z1231 Encounter for screening mammogram for malignant neoplasm of breast: Secondary | ICD-10-CM

## 2017-11-01 DIAGNOSIS — C649 Malignant neoplasm of unspecified kidney, except renal pelvis: Secondary | ICD-10-CM

## 2017-11-01 HISTORY — DX: Malignant neoplasm of unspecified kidney, except renal pelvis: C64.9

## 2017-12-22 DIAGNOSIS — I10 Essential (primary) hypertension: Secondary | ICD-10-CM | POA: Insufficient documentation

## 2017-12-22 DIAGNOSIS — H04123 Dry eye syndrome of bilateral lacrimal glands: Secondary | ICD-10-CM | POA: Insufficient documentation

## 2018-07-18 ENCOUNTER — Other Ambulatory Visit: Payer: Self-pay | Admitting: Urology

## 2018-07-18 DIAGNOSIS — D49512 Neoplasm of unspecified behavior of left kidney: Secondary | ICD-10-CM

## 2018-07-31 ENCOUNTER — Ambulatory Visit (HOSPITAL_COMMUNITY)
Admission: RE | Admit: 2018-07-31 | Discharge: 2018-07-31 | Disposition: A | Discharge status: Home / Self Care | Payer: Commercial Managed Care - PPO | Source: Ambulatory Visit | Attending: Urology | Admitting: Urology

## 2018-07-31 DIAGNOSIS — D49512 Neoplasm of unspecified behavior of left kidney: Secondary | ICD-10-CM

## 2018-07-31 DIAGNOSIS — M5136 Other intervertebral disc degeneration, lumbar region: Secondary | ICD-10-CM | POA: Insufficient documentation

## 2018-07-31 DIAGNOSIS — M47816 Spondylosis without myelopathy or radiculopathy, lumbar region: Secondary | ICD-10-CM | POA: Diagnosis not present

## 2018-07-31 DIAGNOSIS — N281 Cyst of kidney, acquired: Secondary | ICD-10-CM | POA: Insufficient documentation

## 2018-07-31 LAB — POCT I-STAT CREATININE: Creatinine, Ser: 0.6 mg/dL (ref 0.44–1.00)

## 2018-07-31 MED ORDER — GADOBUTROL 1 MMOL/ML IV SOLN
10.0000 mL | Freq: Once | INTRAVENOUS | Status: AC | PRN
  Administered 2018-07-31: 10 mL via INTRAVENOUS

## 2018-08-11 ENCOUNTER — Ambulatory Visit (HOSPITAL_COMMUNITY)
Admission: RE | Admit: 2018-08-11 | Discharge: 2018-08-11 | Disposition: A | Discharge status: Home / Self Care | Payer: Commercial Managed Care - PPO | Source: Ambulatory Visit | Attending: Urology | Admitting: Urology

## 2018-08-11 ENCOUNTER — Other Ambulatory Visit (HOSPITAL_COMMUNITY): Payer: Self-pay | Admitting: Urology

## 2018-08-11 DIAGNOSIS — D49512 Neoplasm of unspecified behavior of left kidney: Secondary | ICD-10-CM | POA: Diagnosis present

## 2018-08-29 ENCOUNTER — Other Ambulatory Visit: Payer: Self-pay | Admitting: Obstetrics & Gynecology

## 2018-08-31 ENCOUNTER — Other Ambulatory Visit: Payer: Self-pay | Admitting: Urology

## 2018-09-04 ENCOUNTER — Other Ambulatory Visit: Payer: Self-pay | Admitting: Obstetrics & Gynecology

## 2018-09-04 DIAGNOSIS — Z1231 Encounter for screening mammogram for malignant neoplasm of breast: Secondary | ICD-10-CM

## 2018-10-06 ENCOUNTER — Other Ambulatory Visit: Payer: Self-pay

## 2018-10-06 ENCOUNTER — Encounter (HOSPITAL_COMMUNITY): Payer: Self-pay

## 2018-10-06 ENCOUNTER — Encounter (HOSPITAL_COMMUNITY)
Admission: RE | Admit: 2018-10-06 | Discharge: 2018-10-06 | Disposition: A | Discharge status: Home / Self Care | Payer: Commercial Managed Care - PPO | Source: Ambulatory Visit | Attending: Urology | Admitting: Urology

## 2018-10-06 DIAGNOSIS — Z01818 Encounter for other preprocedural examination: Secondary | ICD-10-CM

## 2018-10-06 LAB — BASIC METABOLIC PANEL
Anion gap: 9 (ref 5–15)
BUN: 13 mg/dL (ref 8–23)
CALCIUM: 8.7 mg/dL — AB (ref 8.9–10.3)
CO2: 26 mmol/L (ref 22–32)
CREATININE: 0.7 mg/dL (ref 0.44–1.00)
Chloride: 104 mmol/L (ref 98–111)
GFR calc Af Amer: >60 mL/min (ref >60)
GFR calc non Af Amer: >60 mL/min (ref >60)
Glucose, Bld: 116 mg/dL — ABNORMAL HIGH (ref 70–99)
Potassium: 3.7 mmol/L (ref 3.5–5.1)
Sodium: 139 mmol/L (ref 135–145)

## 2018-10-06 LAB — CBC
HCT: 39.3 % (ref 36.0–46.0)
Hemoglobin: 12.2 g/dL (ref 12.0–15.0)
MCH: 27 pg (ref 26.0–34.0)
MCHC: 31 g/dL (ref 30.0–36.0)
MCV: 86.9 fL (ref 80.0–100.0)
Platelets: 236 10*3/uL (ref 150–400)
RBC: 4.52 MIL/uL (ref 3.87–5.11)
RDW: 14.2 % (ref 11.5–15.5)
WBC: 5.6 10*3/uL (ref 4.0–10.5)
nRBC: 0 % (ref 0.0–0.2)

## 2018-10-06 NOTE — Patient Instructions (Addendum)
RAMATOULAYE PACK  10/06/2018   Your procedure is scheduled on: 10/09/2018   Report to [facility] Main  Entrance  Report to admitting at     1030 AM    Call this number if you have problems the morning of surgery [phone]   Remember: Do not eat food or drink liquids :After Midnight. BRUSH YOUR TEETH MORNING OF SURGERY AND RINSE YOUR MOUTH OUT, NO CHEWING GUM CANDY OR MINTS.     Take these medicines the morning of surgery with A SIP OF WATER: none                                 You may not have any metal on your body including hair pins and              piercings  Do not wear jewelry, make-up, lotions, powders or perfumes, deodorant             Do not wear nail polish.  Do not shave  48 hours prior to surgery.               Do not bring valuables to the hospital. [place].  Contacts, dentures or bridgework may not be worn into surgery.  Leave suitcase in the car. After surgery it may be brought to your room.                    Please read over the following fact sheets you were given: _____________________________________________________________________             [facility] - Preparing for Surgery Before surgery, you can play an important role.  Because skin is not sterile, your skin needs to be as free of germs as possible.  You can reduce the number of germs on your skin by washing with CHG (chlorahexidine gluconate) soap before surgery.  CHG is an antiseptic cleaner which kills germs and bonds with the skin to continue killing germs even after washing. Please DO NOT use if you have an allergy to CHG or antibacterial soaps.  If your skin becomes reddened/irritated stop using the CHG and inform your nurse when you arrive at Short Stay. Do not shave (including legs and underarms) for at least 48 hours prior to the first CHG shower.  You may shave your face/neck. Please follow these instructions  carefully:  1.  Shower with CHG Soap the night before surgery and the  morning of Surgery.  2.  If you choose to wash your hair, wash your hair first as usual with your  normal  shampoo.  3.  After you shampoo, rinse your hair and body thoroughly to remove the  shampoo.                           4.  Use CHG as you would any other liquid soap.  You can apply chg directly  to the skin and wash                       Gently with a scrungie or clean washcloth.  5.  Apply the CHG Soap to your body ONLY FROM THE NECK  DOWN.   Do not use on face/ open                           Wound or open sores. Avoid contact with eyes, ears mouth and genitals (private parts).                       Wash face,  Genitals (private parts) with your normal soap.             6.  Wash thoroughly, paying special attention to the area where your surgery  will be performed.  7.  Thoroughly rinse your body with warm water from the neck down.  8.  DO NOT shower/wash with your normal soap after using and rinsing off  the CHG Soap.                9.  Pat yourself dry with a clean towel.            10.  Wear clean pajamas.            11.  Place clean sheets on your bed the night of your first shower and do not  sleep with pets. Day of Surgery : Do not apply any lotions/deodorants the morning of surgery.  Please wear clean clothes to the hospital/surgery center.  FAILURE TO FOLLOW THESE INSTRUCTIONS MAY RESULT IN THE CANCELLATION OF YOUR SURGERY PATIENT SIGNATURE_________________________________  NURSE SIGNATURE__________________________________  ________________________________________________________________________  WHAT IS A BLOOD TRANSFUSION? Blood Transfusion Information  A transfusion is the replacement of blood or some of its parts. Blood is made up of multiple cells which provide different functions.  Red blood cells carry oxygen and are used for blood loss replacement.  White blood cells fight against  infection.  Platelets control bleeding.  Plasma helps clot blood.  Other blood products are available for specialized needs, such as hemophilia or other clotting disorders. BEFORE THE TRANSFUSION  Who gives blood for transfusions?   Healthy volunteers who are fully evaluated to make sure their blood is safe. This is blood bank blood. Transfusion therapy is the safest it has ever been in the practice of medicine. Before blood is taken from a donor, a complete history is taken to make sure that person has no history of diseases nor engages in risky social behavior (examples are intravenous drug use or sexual activity with multiple partners). The donor's travel history is screened to minimize risk of transmitting infections, such as malaria. The donated blood is tested for signs of infectious diseases, such as HIV and hepatitis. The blood is then tested to be sure it is compatible with you in order to minimize the chance of a transfusion reaction. If you or a relative donates blood, this is often done in anticipation of surgery and is not appropriate for emergency situations. It takes many days to process the donated blood. RISKS AND COMPLICATIONS Although transfusion therapy is very safe and saves many lives, the main dangers of transfusion include:   Getting an infectious disease.  Developing a transfusion reaction. This is an allergic reaction to something in the blood you were given. Every precaution is taken to prevent this. The decision to have a blood transfusion has been considered carefully by your caregiver before blood is given. Blood is not given unless the benefits outweigh the risks. AFTER THE TRANSFUSION  Right after receiving a blood transfusion, you will usually feel much better and more energetic.  This is especially true if your red blood cells have gotten low (anemic). The transfusion raises the level of the red blood cells which carry oxygen, and this usually causes an energy  increase.  The nurse administering the transfusion will monitor you carefully for complications. HOME CARE INSTRUCTIONS  No special instructions are needed after a transfusion. You may find your energy is better. Speak with your caregiver about any limitations on activity for underlying diseases you may have. SEEK MEDICAL CARE IF:   Your condition is not improving after your transfusion.  You develop redness or irritation at the intravenous (IV) site. SEEK IMMEDIATE MEDICAL CARE IF:  Any of the following symptoms occur over the next 12 hours:  Shaking chills.  You have a temperature by mouth above 102 F (38.9 C), not controlled by medicine.  Chest, back, or muscle pain.  People around you feel you are not acting correctly or are confused.  Shortness of breath or difficulty breathing.  Dizziness and fainting.  You get a rash or develop hives.  You have a decrease in urine output.  Your urine turns a dark color or changes to pink, red, or brown. Any of the following symptoms occur over the next 10 days:  You have a temperature by mouth above 102 F (38.9 C), not controlled by medicine.  Shortness of breath.  Weakness after normal activity.  The white part of the eye turns yellow (jaundice).  You have a decrease in the amount of urine or are urinating less often.  Your urine turns a dark color or changes to pink, red, or brown. Document Released: 10/15/2000 Document Revised: 01/10/2012 Document Reviewed: 06/03/2008 [facility] Main Patient Information 2014 [place], [place].  _______________________________________________________________________

## 2018-10-07 LAB — ABO/RH: ABO/RH(D): A POS

## 2018-10-08 NOTE — Anesthesia Preprocedure Evaluation (Addendum)
Anesthesia Evaluation  Patient identified by MRN, date of birth, ID band Patient awake    Reviewed: Allergy & Precautions, NPO status , Patient's Chart, lab work & pertinent test results  History of Anesthesia Complications Negative for: history of anesthetic complications  Airway Mallampati: II  TM Distance: >3 FB Neck ROM: Full    Dental  (+) Teeth Intact, Dental Advisory Given   Pulmonary neg pulmonary ROS,    Pulmonary exam normal breath sounds clear to auscultation       Cardiovascular hypertension, Pt. on medications Normal cardiovascular exam Rhythm:Regular Rate:Normal     Neuro/Psych negative neurological ROS     GI/Hepatic negative GI ROS, Neg liver ROS,   Endo/Other  negative endocrine ROS  Renal/GU negative Renal ROSLeft renal mass     Musculoskeletal negative musculoskeletal ROS (+)   Abdominal   Peds  Hematology negative hematology ROS (+)   Anesthesia Other Findings Day of surgery medications reviewed with the patient.  Reproductive/Obstetrics                            Anesthesia Physical Anesthesia Plan  ASA: II  Anesthesia Plan: General   Post-op Pain Management:    Induction: Intravenous  PONV Risk Score and Plan: 3 and Treatment may vary due to age or medical condition, Ondansetron, Dexamethasone and Midazolam  Airway Management Planned: Oral ETT  Additional Equipment:   Intra-op Plan:   Post-operative Plan: Extubation in OR  Informed Consent: I have reviewed the patients History and Physical, chart, labs and discussed the procedure including the risks, benefits and alternatives for the proposed anesthesia with the patient or authorized representative who has indicated his/her understanding and acceptance.   Dental advisory given  Plan Discussed with: CRNA  Anesthesia Plan Comments:        Anesthesia Quick Evaluation

## 2018-10-09 ENCOUNTER — Encounter (HOSPITAL_COMMUNITY)
Admission: RE | Disposition: A | Discharge status: Home / Self Care | Payer: Self-pay | Source: Home / Self Care | Attending: Urology

## 2018-10-09 ENCOUNTER — Inpatient Hospital Stay (HOSPITAL_COMMUNITY): Payer: Commercial Managed Care - PPO | Admitting: Anesthesiology

## 2018-10-09 ENCOUNTER — Encounter (HOSPITAL_COMMUNITY): Payer: Self-pay | Admitting: Certified Registered Nurse Anesthetist

## 2018-10-09 ENCOUNTER — Other Ambulatory Visit: Payer: Self-pay

## 2018-10-09 ENCOUNTER — Inpatient Hospital Stay (HOSPITAL_COMMUNITY)
Admission: RE | Admit: 2018-10-09 | Discharge: 2018-10-10 | DRG: 658 | Disposition: A | Discharge status: Home / Self Care | Payer: Commercial Managed Care - PPO | Attending: Urology | Admitting: Urology

## 2018-10-09 DIAGNOSIS — Z87442 Personal history of urinary calculi: Secondary | ICD-10-CM | POA: Diagnosis not present

## 2018-10-09 DIAGNOSIS — C642 Malignant neoplasm of left kidney, except renal pelvis: Principal | ICD-10-CM | POA: Diagnosis present

## 2018-10-09 DIAGNOSIS — I10 Essential (primary) hypertension: Secondary | ICD-10-CM | POA: Diagnosis present

## 2018-10-09 DIAGNOSIS — N2889 Other specified disorders of kidney and ureter: Secondary | ICD-10-CM | POA: Diagnosis present

## 2018-10-09 DIAGNOSIS — Z90710 Acquired absence of both cervix and uterus: Secondary | ICD-10-CM

## 2018-10-09 DIAGNOSIS — Z880 Allergy status to penicillin: Secondary | ICD-10-CM

## 2018-10-09 DIAGNOSIS — Z800 Family history of malignant neoplasm of digestive organs: Secondary | ICD-10-CM | POA: Diagnosis not present

## 2018-10-09 DIAGNOSIS — Z79890 Hormone replacement therapy (postmenopausal): Secondary | ICD-10-CM | POA: Diagnosis not present

## 2018-10-09 HISTORY — PX: ROBOT ASSISTED LAPAROSCOPIC NEPHRECTOMY: SHX5140

## 2018-10-09 LAB — TYPE AND SCREEN
ABO/RH(D): A POS
Antibody Screen: NEGATIVE

## 2018-10-09 LAB — HEMOGLOBIN AND HEMATOCRIT, BLOOD
HCT: 40.4 % (ref 36.0–46.0)
HEMOGLOBIN: 12.3 g/dL (ref 12.0–15.0)

## 2018-10-09 SURGERY — NEPHRECTOMY, RADICAL, ROBOT-ASSISTED, LAPAROSCOPIC, ADULT
Anesthesia: General | Laterality: Left

## 2018-10-09 MED ORDER — EVICEL 5 ML EX KIT
PACK | Freq: Once | CUTANEOUS | Status: AC
  Administered 2018-10-09: 1
  Filled 2018-10-09: qty 1

## 2018-10-09 MED ORDER — HYDROMORPHONE HCL 1 MG/ML IJ SOLN
INTRAMUSCULAR | Status: AC
  Filled 2018-10-09: qty 2

## 2018-10-09 MED ORDER — DOCUSATE SODIUM 100 MG PO CAPS: 100.0000 mg | ORAL_CAPSULE | Freq: Two times a day (BID) | ORAL | Status: DC

## 2018-10-09 MED ORDER — BUPIVACAINE-EPINEPHRINE (PF) 0.25% -1:200000 IJ SOLN
INTRAMUSCULAR | Status: DC | PRN
  Administered 2018-10-09: 30 mL

## 2018-10-09 MED ORDER — DEXTROSE-NACL 5-0.45 % IV SOLN
INTRAVENOUS | Status: DC
  Administered 2018-10-09 – 2018-10-10 (×2): via INTRAVENOUS

## 2018-10-09 MED ORDER — FENTANYL CITRATE (PF) 100 MCG/2ML IJ SOLN
INTRAMUSCULAR | Status: DC | PRN
  Administered 2018-10-09 (×2): 50 ug via INTRAVENOUS
  Administered 2018-10-09: 25 ug via INTRAVENOUS
  Administered 2018-10-09 (×2): 50 ug via INTRAVENOUS
  Administered 2018-10-09: 25 ug via INTRAVENOUS
  Administered 2018-10-09: 100 ug via INTRAVENOUS

## 2018-10-09 MED ORDER — ONDANSETRON HCL 4 MG/2ML IJ SOLN: 4.0000 mg | INTRAMUSCULAR | Status: DC | PRN

## 2018-10-09 MED ORDER — DIPHENHYDRAMINE HCL 50 MG/ML IJ SOLN: 12.5000 mg | Freq: Four times a day (QID) | INTRAMUSCULAR | Status: DC | PRN

## 2018-10-09 MED ORDER — SUGAMMADEX SODIUM 200 MG/2ML IV SOLN
INTRAVENOUS | Status: DC | PRN
  Administered 2018-10-09: 200 mg via INTRAVENOUS

## 2018-10-09 MED ORDER — BELLADONNA ALKALOIDS-OPIUM 16.2-60 MG RE SUPP: 1.0000 | Freq: Four times a day (QID) | RECTAL | Status: DC | PRN

## 2018-10-09 MED ORDER — CIPROFLOXACIN IN D5W 400 MG/200ML IV SOLN
400.0000 mg | Freq: Two times a day (BID) | INTRAVENOUS | Status: AC
  Administered 2018-10-10: 400 mg via INTRAVENOUS
  Filled 2018-10-09: qty 200

## 2018-10-09 MED ORDER — MIDAZOLAM HCL 5 MG/5ML IJ SOLN
INTRAMUSCULAR | Status: DC | PRN
  Administered 2018-10-09: 2 mg via INTRAVENOUS

## 2018-10-09 MED ORDER — BUPIVACAINE LIPOSOME 1.3 % IJ SUSP
20.0000 mL | Freq: Once | INTRAMUSCULAR | Status: AC
  Administered 2018-10-09: 20 mL
  Filled 2018-10-09: qty 20

## 2018-10-09 MED ORDER — HYDROCODONE-ACETAMINOPHEN 5-325 MG PO TABS: 1.0000 | ORAL_TABLET | ORAL | Status: DC | PRN

## 2018-10-09 MED ORDER — LACTATED RINGERS IV SOLN
INTRAVENOUS | Status: DC
  Administered 2018-10-09 (×2): via INTRAVENOUS

## 2018-10-09 MED ORDER — ACETAMINOPHEN 10 MG/ML IV SOLN: 1000.0000 mg | Freq: Once | INTRAVENOUS | Status: DC | PRN

## 2018-10-09 MED ORDER — ONDANSETRON HCL 4 MG/2ML IJ SOLN
INTRAMUSCULAR | Status: DC | PRN
  Administered 2018-10-09: 4 mg via INTRAVENOUS

## 2018-10-09 MED ORDER — MIDAZOLAM HCL 2 MG/2ML IJ SOLN
INTRAMUSCULAR | Status: AC
  Filled 2018-10-09: qty 2

## 2018-10-09 MED ORDER — PROMETHAZINE HCL 12.5 MG PO TABS: 12.5000 mg | ORAL_TABLET | ORAL | 0 refills | Status: DC | PRN

## 2018-10-09 MED ORDER — OXYCODONE HCL 5 MG/5ML PO SOLN
5.0000 mg | Freq: Once | ORAL | Status: DC | PRN
  Filled 2018-10-09: qty 5

## 2018-10-09 MED ORDER — HYDROCODONE-ACETAMINOPHEN 5-325 MG PO TABS: 1.0000 | ORAL_TABLET | Freq: Four times a day (QID) | ORAL | 0 refills | Status: DC | PRN

## 2018-10-09 MED ORDER — DEXAMETHASONE SODIUM PHOSPHATE 4 MG/ML IJ SOLN
INTRAMUSCULAR | Status: DC | PRN
  Administered 2018-10-09: 5 mg via INTRAVENOUS

## 2018-10-09 MED ORDER — PROPOFOL 10 MG/ML IV BOLUS
INTRAVENOUS | Status: AC
  Filled 2018-10-09: qty 20

## 2018-10-09 MED ORDER — CIPROFLOXACIN IN D5W 400 MG/200ML IV SOLN
400.0000 mg | Freq: Once | INTRAVENOUS | Status: AC
  Administered 2018-10-09: 400 mg via INTRAVENOUS
  Filled 2018-10-09: qty 200

## 2018-10-09 MED ORDER — PROMETHAZINE HCL 25 MG/ML IJ SOLN: 6.2500 mg | INTRAMUSCULAR | Status: DC | PRN

## 2018-10-09 MED ORDER — LIDOCAINE 2% (20 MG/ML) 5 ML SYRINGE
INTRAMUSCULAR | Status: DC | PRN
  Administered 2018-10-09: 80 mg via INTRAVENOUS

## 2018-10-09 MED ORDER — ROCURONIUM BROMIDE 10 MG/ML (PF) SYRINGE
PREFILLED_SYRINGE | INTRAVENOUS | Status: DC | PRN
  Administered 2018-10-09: 50 mg via INTRAVENOUS
  Administered 2018-10-09 (×2): 20 mg via INTRAVENOUS
  Administered 2018-10-09: 10 mg via INTRAVENOUS

## 2018-10-09 MED ORDER — FENTANYL CITRATE (PF) 250 MCG/5ML IJ SOLN
INTRAMUSCULAR | Status: AC
  Filled 2018-10-09: qty 5

## 2018-10-09 MED ORDER — ACETAMINOPHEN 325 MG PO TABS
650.0000 mg | ORAL_TABLET | ORAL | Status: DC | PRN
  Administered 2018-10-09 – 2018-10-10 (×3): 650 mg via ORAL
  Filled 2018-10-09 (×3): qty 2

## 2018-10-09 MED ORDER — DIPHENHYDRAMINE HCL 12.5 MG/5ML PO ELIX: 12.5000 mg | ORAL_SOLUTION | Freq: Four times a day (QID) | ORAL | Status: DC | PRN

## 2018-10-09 MED ORDER — HYDROMORPHONE HCL 1 MG/ML IJ SOLN
0.2500 mg | INTRAMUSCULAR | Status: DC | PRN
  Administered 2018-10-09 (×2): 0.5 mg via INTRAVENOUS

## 2018-10-09 MED ORDER — SIMETHICONE 80 MG PO CHEW
80.0000 mg | CHEWABLE_TABLET | Freq: Three times a day (TID) | ORAL | Status: DC | PRN
  Administered 2018-10-09 – 2018-10-10 (×2): 80 mg via ORAL
  Filled 2018-10-09 (×2): qty 1

## 2018-10-09 MED ORDER — HYDROMORPHONE HCL 1 MG/ML IJ SOLN: 0.5000 mg | INTRAMUSCULAR | Status: DC | PRN

## 2018-10-09 MED ORDER — BUPIVACAINE-EPINEPHRINE (PF) 0.25% -1:200000 IJ SOLN
INTRAMUSCULAR | Status: AC
  Filled 2018-10-09: qty 30

## 2018-10-09 MED ORDER — PROPOFOL 10 MG/ML IV BOLUS
INTRAVENOUS | Status: DC | PRN
  Administered 2018-10-09: 150 mg via INTRAVENOUS

## 2018-10-09 MED ORDER — OXYCODONE HCL 5 MG PO TABS: 5.0000 mg | ORAL_TABLET | Freq: Once | ORAL | Status: DC | PRN

## 2018-10-09 SURGICAL SUPPLY — 61 items
BAG LAPAROSCOPIC 12 15 PORT 16 (BASKET) IMPLANT
BAG RETRIEVAL 12/15 (BASKET)
CHLORAPREP W/TINT 26ML (MISCELLANEOUS) ×2 IMPLANT
CLIP VESOLOCK LG 6/CT PURPLE (CLIP) ×2 IMPLANT
CLIP VESOLOCK MED LG 6/CT (CLIP) ×4 IMPLANT
CLIP VESOLOCK XL 6/CT (CLIP) ×2 IMPLANT
COVER SURGICAL LIGHT HANDLE (MISCELLANEOUS) ×2 IMPLANT
COVER TIP SHEARS 8 DVNC (MISCELLANEOUS) ×1 IMPLANT
COVER TIP SHEARS 8MM DA VINCI (MISCELLANEOUS) ×1
COVER WAND RF STERILE (DRAPES) ×2 IMPLANT
DECANTER SPIKE VIAL GLASS SM (MISCELLANEOUS) IMPLANT
DRAPE ARM DVNC X/XI (DISPOSABLE) ×4 IMPLANT
DRAPE COLUMN DVNC XI (DISPOSABLE) ×1 IMPLANT
DRAPE DA VINCI XI ARM (DISPOSABLE) ×4
DRAPE DA VINCI XI COLUMN (DISPOSABLE) ×1
DRAPE INCISE IOBAN 66X45 STRL (DRAPES) ×2 IMPLANT
DRAPE SHEET LG 3/4 BI-LAMINATE (DRAPES) ×2 IMPLANT
ELECT PENCIL ROCKER SW 15FT (MISCELLANEOUS) ×2 IMPLANT
ELECT REM PT RETURN 15FT ADLT (MISCELLANEOUS) ×2 IMPLANT
GLOVE BIO SURGEON STRL SZ 6.5 (GLOVE) ×6 IMPLANT
GLOVE BIOGEL M STRL SZ7.5 (GLOVE) ×4 IMPLANT
GLOVE BIOGEL PI IND STRL 7.0 (GLOVE) ×2 IMPLANT
GLOVE BIOGEL PI INDICATOR 7.0 (GLOVE) ×2
GOWN STRL REUS W/TWL LRG LVL3 (GOWN DISPOSABLE) ×6 IMPLANT
GOWN STRL REUS W/TWL XL LVL3 (GOWN DISPOSABLE) ×4 IMPLANT
HEMOSTAT SURGICEL 4X8 (HEMOSTASIS) ×2 IMPLANT
HOLDER FOLEY CATH W/STRAP (MISCELLANEOUS) ×2 IMPLANT
IRRIG SUCT STRYKERFLOW 2 WTIP (MISCELLANEOUS) ×2
IRRIGATION SUCT STRKRFLW 2 WTP (MISCELLANEOUS) ×1 IMPLANT
KIT BASIN OR (CUSTOM PROCEDURE TRAY) ×2 IMPLANT
NEEDLE INSUFFLATION 14GA 120MM (NEEDLE) ×2 IMPLANT
NS IRRIG 1000ML POUR BTL (IV SOLUTION) IMPLANT
PORT ACCESS TROCAR AIRSEAL 12 (TROCAR) ×1 IMPLANT
PORT ACCESS TROCAR AIRSEAL 5M (TROCAR) ×1
POUCH SPECIMEN RETRIEVAL 10MM (ENDOMECHANICALS) ×2 IMPLANT
PROTECTOR NERVE ULNAR (MISCELLANEOUS) ×4 IMPLANT
RELOAD STAPLER WHITE 60MM (STAPLE) IMPLANT
SCISSORS LAP 5X35 DISP (ENDOMECHANICALS) ×2 IMPLANT
SEAL CANN UNIV 5-8 DVNC XI (MISCELLANEOUS) ×4 IMPLANT
SEAL XI 5MM-8MM UNIVERSAL (MISCELLANEOUS) ×4
SET TRI-LUMEN FLTR TB AIRSEAL (TUBING) ×2 IMPLANT
SOLUTION ELECTROLUBE (MISCELLANEOUS) ×2 IMPLANT
STAPLE ECHEON FLEX 60 POW ENDO (STAPLE) IMPLANT
STAPLER RELOAD WHITE 60MM (STAPLE)
SUT PDS AB 0 CT1 36 (SUTURE) ×4 IMPLANT
SUT V-LOC BARB 180 2/0GR6 GS22 (SUTURE) ×2
SUT VIC 0 UR6 27 ×2 IMPLANT
SUT VIC AB 0 CT1 27 (SUTURE) ×2
SUT VIC AB 0 CT1 27XBRD ANTBC (SUTURE) ×2 IMPLANT
SUT VIC AB 1 CT1 27 (SUTURE) ×5
SUT VIC AB 1 CT1 27XBRD ANTBC (SUTURE) ×5 IMPLANT
SUT VICRYL 0 UR6 27IN ABS (SUTURE) IMPLANT
SUT VLOC BARB 180 ABS3/0GR12 (SUTURE) ×2
SUTURE V-LC BRB 180 2/0GR6GS22 (SUTURE) ×1 IMPLANT
SUTURE VLOC BRB 180 ABS3/0GR12 (SUTURE) ×1 IMPLANT
TOWEL OR 17X26 10 PK STRL BLUE (TOWEL DISPOSABLE) ×2 IMPLANT
TOWEL OR NON WOVEN STRL DISP B (DISPOSABLE) ×2 IMPLANT
TRAY FOLEY MTR SLVR 14FR STAT (SET/KITS/TRAYS/PACK) ×2 IMPLANT
TRAY LAPAROSCOPIC (CUSTOM PROCEDURE TRAY) ×2 IMPLANT
TROCAR BLADELESS OPT 5 100 (ENDOMECHANICALS) IMPLANT
WATER STERILE IRR 1000ML POUR (IV SOLUTION) ×2 IMPLANT

## 2018-10-09 NOTE — Discharge Instructions (Signed)

## 2018-10-09 NOTE — Anesthesia Procedure Notes (Signed)
Procedure Name: Intubation Date/Time: 10/09/2018 12:47 PM Performed by: Claudia Desanctis, CRNA Pre-anesthesia Checklist: Patient identified, Emergency Drugs available, Suction available and Patient being monitored Patient Re-evaluated:Patient Re-evaluated prior to induction Oxygen Delivery Method: Circle system utilized Preoxygenation: Pre-oxygenation with 100% oxygen Induction Type: IV induction Ventilation: Mask ventilation without difficulty Laryngoscope Size: 2 and Miller Grade View: Grade I Tube type: Oral Tube size: 7.0 mm Number of attempts: 1 Airway Equipment and Method: Stylet Placement Confirmation: ETT inserted through vocal cords under direct vision,  positive ETCO2 and breath sounds checked- equal and bilateral Secured at: 21 cm Tube secured with: Tape Dental Injury: Teeth and Oropharynx as per pre-operative assessment

## 2018-10-09 NOTE — Discharge Summary (Signed)
[facility] Discharge Summary  Admit date: 10/09/2018  Discharge date and time: 10/10/18   Discharge to: Home  Discharge Service: Urology  Discharge Attending Physician:  [name], MD  Discharge  Diagnoses: Left renal mass  Secondary Diagnosis: Active Problems:   Renal mass   OR Procedures: Procedure(s): XI ROBOTIC ASSISTED LAPAROSCOPIC PARTIAL NEPHRECTOMY 10/09/2018   Ancillary Procedures: None   Discharge Day Services: The patient was seen and examined by the Urology team both in the morning and immediately prior to discharge.  Vital signs and laboratory values were stable and within normal limits.  The physical exam was benign and unchanged and all surgical wounds were examined.  Discharge instructions were explained and all questions answered.  Subjective  No acute events overnight. Pain Controlled. No fever or chills.  Objective Patient Vitals for the past 8 hrs:  BP Temp Temp src Pulse Resp SpO2  10/10/18 0458 122/67 98.3 F (36.8 C) Oral 71 20 99 %   No intake/output data recorded.  General Appearance:       No acute distress Lungs:                     Normal work of breathing on room air Heart:                               Regular rate and rhythm Abdomen:                        Soft, non-tender, non-distended, incisions c/d/i Extremities:                     Warm and well perfused   Hospital Course:  The patient underwent left robot-assisted partial nephrectomy on 10/09/2018.  The patient tolerated the procedure well, was extubated in the OR, and afterwards was taken to the PACU for routine post-surgical care. When stable the patient was transferred to the floor.   The patient did well postoperatively.  The patient's diet was slowly advanced and at the time of discharge was tolerating a regular diet.  The patient was discharged home Day of Surgery, at which point was tolerating a regular solid diet, was able to void spontaneously, have adequate pain  control with P.O. pain medication, and could ambulate without difficulty. The patient will follow up with [place] for post op check.   Condition at Discharge: Improved  Discharge Medications:  Allergies as of 10/10/2018      Reactions   Penicillins Hives, Rash   Has patient had a PCN reaction causing immediate rash, facial/tongue/throat swelling, SOB or lightheadedness with hypotension: No Has patient had a PCN reaction causing severe rash involving mucus membranes or skin necrosis: No Has patient had a PCN reaction that required hospitalization: No Has patient had a PCN reaction occurring within the last 10 years: No If all of the above answers are "NO", then may proceed with Cephalosporin use.      Medication List    STOP taking these medications   Biotin 1000 MCG tablet   cholecalciferol 1000 units tablet Commonly known as:  VITAMIN D   Cinnamon 500 MG Tabs   COSAMIN DS PO   multivitamin with minerals Tabs tablet   SYSTANE BALANCE 0.6 % Soln Generic drug:  Propylene Glycol   Turmeric 500 MG Tabs   vitamin B-12 500 MCG tablet Commonly known as:  CYANOCOBALAMIN   Vitamin  D3 50 MCG (2000 UT) Tabs     TAKE these medications   acetaminophen 500 MG tablet Commonly known as:  TYLENOL Take 500-1,000 mg by mouth every 8 (eight) hours as needed for mild pain or headache (depends on pain if takes 1-2 tablets).   docusate sodium 100 MG capsule Commonly known as:  COLACE Take 1 capsule (100 mg total) by mouth 2 (two) times daily.   estradiol 0.05 MG/24HR patch Commonly known as:  VIVELLE-DOT Place 1 patch onto the skin 2 (two) times a week. Sundays & Thursdays   HYDROcodone-acetaminophen 5-325 MG tablet Commonly known as:  NORCO/VICODIN Take 1-2 tablets by mouth every 6 (six) hours as needed for moderate pain.   lisinopril 10 MG tablet Commonly known as:  PRINIVIL,ZESTRIL Take 10 mg by mouth daily.   promethazine 12.5 MG tablet Commonly known as:  PHENERGAN Take 1  tablet (12.5 mg total) by mouth every 4 (four) hours as needed for nausea or vomiting.

## 2018-10-09 NOTE — Anesthesia Postprocedure Evaluation (Signed)
Anesthesia Post Note  Patient: AMIRIA ORRISON  Procedure(s) Performed: XI ROBOTIC ASSISTED LAPAROSCOPIC PARTIAL NEPHRECTOMY (Left )     Patient location during evaluation: PACU Anesthesia Type: General Level of consciousness: awake and alert Pain management: pain level controlled Vital Signs Assessment: post-procedure vital signs reviewed and stable Respiratory status: spontaneous breathing, nonlabored ventilation and respiratory function stable Cardiovascular status: blood pressure returned to baseline and stable Postop Assessment: no apparent nausea or vomiting Anesthetic complications: no    Last Vitals:  Vitals:   10/09/18 1645 10/09/18 1712  BP: 137/73 (!) 141/75  Pulse: 71 69  Resp: 15 18  Temp: (!) 36.4 C 36.8 C  SpO2: 93% 100%    Last Pain:  Vitals:   10/09/18 1712  TempSrc: Oral  PainSc:                  Brennan Bailey

## 2018-10-09 NOTE — H&P (Signed)
Urology Preoperative H&P   Chief Complaint: Left renal mass  History of Present Illness: Janice Guerra is a 62 y.o. female with female referred by Dr. Jeffie Pollock to discuss surgical/treatment options for her 2.5 x 1.7 cm solid left renal mass seen on recent MRI and CT. The lesion was initially discovered during a routine RUS for kidney stone surveillance on 06/27/18.   The patient is a nonsmoker and has no personal/family history of GU malignancies. 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   From a urinary standpoint, she reports a good FOS and feels like she is emptying her bladder well. She has occasional urgency/frequency, but is not bothered by it. Nocturia x 1. Denies flank pain, interval UTIs, dysuria or hematuria.   Last serum creatinine- 0.5 (06/27/18)    Past Medical History:  Diagnosis Date  . Headache(784.0)    due to shingles 5 yr ago -RESOLVED  . History of kidney stones   . HNP (herniated nucleus pulposus), lumbar    EPIDURAL 2013 - NO SURGERY - STILL HAS OCCAS BACK PAIN  . Hypertension   . Sty, internal 02/15/2017   right eye    Past Surgical History:  Procedure Laterality Date  . ABDOMINAL HYSTERECTOMY    . CYSTOSCOPY WITH RETROGRADE PYELOGRAM, URETEROSCOPY AND STENT PLACEMENT Left 05/03/2013   Procedure: CYSTOSCOPY WITH LEFT RETROGRADE PYELOGRAM, LEFT URETEROSCOPY AND LEFT URETERAL STENT PLACEMENT;  Surgeon: Malka So, MD;  Location: [facility];  Service: Urology;  Laterality: Left;  . CYSTOSCOPY WITH RETROGRADE PYELOGRAM, URETEROSCOPY AND STENT PLACEMENT Right 03/31/2017   Procedure: CYSTOSCOPY WITH RIGHT  RETROGRADE PYELOGRAM, URETEROSCOPY HOLMIUM LASER  AND STENT PLACEMENT;  Surgeon: Irine Seal, MD;  Location: [facility];  Service: Urology;  Laterality: Right;  . CYSTOSCOPY/URETEROSCOPY/HOLMIUM LASER/STENT PLACEMENT     03/31/17  . EXTRACORPOREAL SHOCK WAVE LITHOTRIPSY Right 02/24/2017   Procedure: RIGHT EXTRACORPOREAL SHOCK WAVE LITHOTRIPSY  (ESWL);  Surgeon: Alexis Frock, MD;  Location: [facility];  Service: Urology;  Laterality: Right;  . HOLMIUM LASER APPLICATION Left 11/06/1094   Procedure: HOLMIUM LASER APPLICATION;  Surgeon: Malka So, MD;  Location: [facility];  Service: Urology;  Laterality: Left;  . HOLMIUM LASER APPLICATION Right 0/45/4098   Procedure: HOLMIUM LASER APPLICATION;  Surgeon: Irine Seal, MD;  Location: [facility];  Service: Urology;  Laterality: Right;  . LITHOTRIPSY  2012   for kidney stones x 3  . LITHOTRIPSY     3 WITHIN PAST 18 MONTHS  . lower Rt parathyroid gland removed  20012  . Rt hand trigger finger  2008    Allergies:  Allergies  Allergen Reactions  . Penicillins Hives and Rash    Has patient had a PCN reaction causing immediate rash, facial/tongue/throat swelling, SOB or lightheadedness with hypotension: No Has patient had a PCN reaction causing severe rash involving mucus membranes or skin necrosis: No Has patient had a PCN reaction that required hospitalization: No Has patient had a PCN reaction occurring within the last 10 years: No If all of the above answers are "NO", then may proceed with Cephalosporin use.     Family History  Problem Relation Age of Onset  . Colon cancer Paternal Uncle 67  . Colon cancer Maternal Grandfather 37  . Stomach cancer Neg Hx     Social History:  reports that she has never smoked. She has never used smokeless tobacco. She reports that she does not drink alcohol or use drugs.  ROS: A complete review of  systems was performed.  All systems are negative except for pertinent findings as noted.  Physical Exam:  Vital signs in last 24 hours:   Constitutional:  Alert and oriented, No acute distress Cardiovascular: Regular rate and rhythm, No JVD Respiratory: Normal respiratory effort, Lungs clear bilaterally GI: Abdomen is soft, nontender, nondistended, no abdominal masses GU: No CVA tenderness Lymphatic: No lymphadenopathy Neurologic: Grossly intact, no  focal deficits Psychiatric: Normal mood and affect  Laboratory Data:  Recent Labs    10/06/18 1833  WBC 5.6  HGB 12.2  HCT 39.3  PLT 236    Recent Labs    10/06/18 1833  NA 139  K 3.7  CL 104  GLUCOSE 116*  BUN 13  CALCIUM 8.7*  CREATININE 0.70     No results found for this or any previous visit (from the past 24 hour(s)). No results found for this or any previous visit (from the past 240 hour(s)).  Renal Function: Recent Labs    10/06/18 1833  CREATININE 0.70   Estimated Creatinine Clearance: 90.2 mL/min (by C-G formula based on SCr of 0.7 mg/dL).  Radiologic Imaging: No results found.  I independently reviewed the above imaging studies.  Assessment and Plan Janice Guerra is a 63 y.o. female with 2.5 cm solid and enhancing left renal mass concerning for renal cell carcinoma  -I reviewed imaging results and films with the patient personally. We discussed that the left renal mass in question has features concerning for malignancy. I explained the natural history of presumed renal cell carcinoma. I reviewed the AUA guidelines for evaluation and treatment of the small renal mass. The options of active surveillance, in situ tumor ablation, partial and radical nephrectomy was discussed. The risks of robotic partial nephrectomy were discussed in detail including but not limited to: negative pathology, open conversion, completion nephrectomy, infection of the urinary tract/skin/abdominal cavity, VTE, MI/CVA, lymphatic leak, injury to adjacent solid/hollow viscus organs, bleeding requiring a blood transfusion, catastrophic bleeding, hernia formation, need for postoperative angioembolization, urinary leak requiring stent/drain, and other imponderables. The patient voices understanding and wishes to proceed with robotic-assisted laparoscopic LEFT partial nephrectomy.   Janice Hughs, MD 10/09/2018, 9:14 AM  [facility] Pager: [phone]

## 2018-10-09 NOTE — Op Note (Signed)
Operative Note  Preoperative diagnosis:  1.  2.5 cm left renal mass  Postoperative diagnosis: 1.  Same  Procedure(s): 1.  Robot-assisted laparoscopic left partial nephrectomy  Surgeon: [name], MD  Assistants:   1.  Debbrah Alar, [employer] An assistant was required for this surgical procedure.  The duties of the assistant included but were not limited to suctioning, passing suture, camera manipulation, retraction.  This procedure would not be able to be performed without an Environmental consultant.  2.  Arminda Resides, MD PGY-4  Anesthesia:  General  Complications:  None  EBL:  100 mL  Specimens: 1. Left renal mass  Drains/Catheters: 1.  Foley catheter  Intraoperative findings:   1. Solid appearing left anterior renal mass excised with grossly negative margins 2. The renorrhaphy was hemostatic following repair.  Indication:  Janice Guerra is a 62 y.o. female referred by Dr. Jeffie Pollock to discuss surgical/treatment options for her 2.5 x 1.7 cm solid left renal mass seen on recent MRI and CT. The lesion was initially discovered during a routine RUS for kidney stone surveillance on 06/27/18.   The patient is a nonsmoker and has no personal/family history of GU malignancies. 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   From a urinary standpoint, she reports a good FOS and feels like she is emptying her bladder well. She has occasional urgency/frequency, but is not bothered by it. Nocturia x 1. Denies flank pain, interval UTIs, dysuria or hematuria.    Description of procedure:  After informed consent was obtained, the patient was brought to the operating room and general endotracheal anesthesia was administered.  The patient was then placed in the right lateral decubitus position and prepped and draped in usual sterile fashion.  A timeout was performed.  An 8 mm incision was then made lateral to the left rectus muscle at the level of the left 12th rib.  Abdominal  access was obtained via a Veress needle.  The abdominal cavity was then insufflated up to 15 mmHg.  An 8 mm port was then introduced into the abdominal cavity.  Inspection of the port entry site by the robotic camera revealed no adjacent organ injury.  We then placed 3 additional 8 mm robotic ports to triangulate the left renal hilum.  A 12 mm assistant port was then placed between the carmera port and 3rd robotic arm.  The white line of Toldt along the descending colon was incised sharply and the colon, along with its mesocolonic fat, was reflected medially until the aorta was identified.  We then made a small window adjacent to the lower pole of the left kidney, identifying the left psoas muscle, left ureter and left gonadal vein.  The left ureter and gonadal vein were then reflected anteriorly allowing [place] to then incised the perihilar attachments using electrocautery.  We encountered a small lumbar vein adjacent to the insertion of the left gonadal vein into the left renal vein.  This lumbar vein was ligated with hemo-lock clips in 2 places and incised sharply.  This provided [place] excellent exposure to the left renal hilum.  The perilymphatic tissue surrounding the left renal artery was carefully dissected away, creating a window to place a bulldog clamp later in the procedure.  The anterior portion of Gerota's fascia was incised, allowing reflection the perinephric fat medially and laterally until there was adequate exposure of the anterior left renal mass.  Intraoperative ultrasound confirmed the heterogenous echogenicity of the lesion compared to the remainder  of the renal parenchyma and allowed identification of the depth/borders of the mass, which were demarcated using electrocautery along the renal capsule.  We then exposed the left renal artery and placed a bulldog clamp, marking warm ischemia time.  The left kidney immediately became ischemic and pale in appearance.  The left renal mass was then sharply  excised with minimal blood loss.  After the mass was free, it was placed in the left upper quadrant to be retrieved later on during the operation.    The renorrhaphy was then performed using a 3-0 V-lock in the deep layer of the renal parenchyma and then a series of horizontal mattress sutures were used to reapproximate the renal capsule using interupted 1-0 Vicryl suture along with hemo-lock clips act as a buttress against the renal capsule.  Once adequate tension was placed on the renorrhaphy sutures and the repair appeared hemostatic, we then removed the bulldog clamp marking the end of warm ischemia time at 11 minutes.  There did not appear to be any obvious bleeding around the renal hilum nor surrounding our repair.  The incised Gerota's fascia overlying the mass was then reapproximated using a running 2-0 V lock suture.  The mass was then placed in an Endo Catch bag.  The robot was then de-docked and the camera was then reinserted into the assistant port. Laparoscopic graspers were then used to grab the string of the Endo Catch bag, which was brought out through the 12 mm assistant port.  The abdomen was then desufflated and all ports were removed.  The assistant port incision was then extended approximately 1 cm and the left renal mass, within the Endo Catch bag, was removed and sent to pathology for permanent section.  The fascia within the assistant port incision was then reapproximated using a 0 Vicryl suture.  The remainder of the incisions were then closed using 4-0 Monocryl and dressed appropriately.  Patient tolerated the procedure well and was transferred to the postanesthesia unit in stable condition.   Plan:  Bedrest overnight.  Labs in PACU and in the AM.  Foley out in the AM.

## 2018-10-09 NOTE — Transfer of Care (Signed)
Immediate Anesthesia Transfer of Care Note  Patient: Janice Guerra  Procedure(s) Performed: XI ROBOTIC ASSISTED LAPAROSCOPIC PARTIAL NEPHRECTOMY (Left )  Patient Location: PACU  Anesthesia Type:General  Level of Consciousness: drowsy  Airway & Oxygen Therapy: Patient Spontanous Breathing and Patient connected to face mask  Post-op Assessment: Report given to RN and Post -op Vital signs reviewed and stable  Post vital signs: Reviewed and stable  Last Vitals:  Vitals Value Taken Time  BP 130/118 10/09/2018  3:55 PM  Temp    Pulse 66 10/09/2018  3:59 PM  Resp 17 10/09/2018  3:59 PM  SpO2 100 % 10/09/2018  3:59 PM  Vitals shown include unvalidated device data.  Last Pain:  Vitals:   10/09/18 1107  TempSrc:   PainSc: 0-No pain         Complications: No apparent anesthesia complications

## 2018-10-10 ENCOUNTER — Encounter (HOSPITAL_COMMUNITY): Payer: Self-pay | Admitting: Urology

## 2018-10-10 LAB — BASIC METABOLIC PANEL
Anion gap: 9 (ref 5–15)
BUN: 8 mg/dL (ref 8–23)
CHLORIDE: 103 mmol/L (ref 98–111)
CO2: 26 mmol/L (ref 22–32)
Calcium: 8.4 mg/dL — ABNORMAL LOW (ref 8.9–10.3)
Creatinine, Ser: 0.59 mg/dL (ref 0.44–1.00)
GFR calc Af Amer: >60 mL/min (ref >60)
GFR calc non Af Amer: >60 mL/min (ref >60)
Glucose, Bld: 130 mg/dL — ABNORMAL HIGH (ref 70–99)
Potassium: 3.7 mmol/L (ref 3.5–5.1)
SODIUM: 138 mmol/L (ref 135–145)

## 2018-10-10 LAB — HEMOGLOBIN AND HEMATOCRIT, BLOOD
HCT: 38.6 % (ref 36.0–46.0)
HEMOGLOBIN: 11.9 g/dL — AB (ref 12.0–15.0)

## 2018-10-10 MED ORDER — DOCUSATE SODIUM 100 MG PO CAPS
100.0000 mg | ORAL_CAPSULE | Freq: Two times a day (BID) | ORAL | Status: DC
  Administered 2018-10-10: 100 mg via ORAL
  Filled 2018-10-10: qty 1

## 2018-10-10 MED ORDER — SENNA 8.6 MG PO TABS: 1.0000 | ORAL_TABLET | Freq: Every day | ORAL | Status: DC

## 2018-10-10 NOTE — Progress Notes (Signed)
Urology Progress Note   1 Day Post-Op  Subjective: NAEON. Pain controlled. Tolerating CLD without nausea. Passing flatus. No ambulation yet.   Objective: Vital signs in last 24 hours: Temp:  [97.5 F (36.4 C)-98.3 F (36.8 C)] 98.3 F (36.8 C) (12/10 0458) Pulse Rate:  [65-72] 71 (12/10 0458) Resp:  [13-20] 20 (12/10 0458) BP: (120-141)/(67-84) 122/67 (12/10 0458) SpO2:  [93 %-100 %] 99 % (12/10 0458) Weight:  [88.5 kg] 88.5 kg (12/09 1107)  Intake/Output from previous day: 12/09 0701 - 12/10 0700 In: 4529.2 [P.O.:1542; I.V.:2787.2; IV Piggyback:200] Out: 2750 [Urine:2725; Blood:25] Intake/Output this shift: No intake/output data recorded.  Physical Exam:  General: Alert and oriented CV: RRR Lungs: Clear Abdomen: Soft, appropriately tender. Incisions c/d/i with dermabond.  GU: Foley in place draining clear yellow urine  Ext: NT, No erythema  Lab Results: Recent Labs    10/09/18 1612 10/10/18 0537  HGB 12.3 11.9*  HCT 40.4 38.6   BMET Recent Labs    10/10/18 0537  NA 138  K 3.7  CL 103  CO2 26  GLUCOSE 130*  BUN 8  CREATININE 0.59  CALCIUM 8.4*     Studies/Results: No results found.  Assessment/Plan:  62 y.o. female s/p left partial nephrectomy on 10/09/18.  Overall doing well post-op.   - Advance to regular diet. [place] IVF. - DC foley, perform trial of void.  - Ambulate, Incentive spirometry - Transition to oral pain medication - Start bowel regimen (senna, colace) - Plan for possible discharge later today     LOS: 1 day   Dorothey Baseman 10/10/2018, 7:22 AM

## 2018-10-23 ENCOUNTER — Ambulatory Visit: Payer: 59

## 2018-10-30 ENCOUNTER — Ambulatory Visit: Payer: 59

## 2018-11-09 ENCOUNTER — Ambulatory Visit
Admission: RE | Admit: 2018-11-09 | Discharge: 2018-11-09 | Disposition: A | Discharge status: Home / Self Care | Payer: Commercial Managed Care - PPO | Source: Ambulatory Visit | Attending: Obstetrics & Gynecology | Admitting: Obstetrics & Gynecology

## 2018-11-09 DIAGNOSIS — Z1231 Encounter for screening mammogram for malignant neoplasm of breast: Secondary | ICD-10-CM

## 2018-11-13 ENCOUNTER — Other Ambulatory Visit: Payer: Self-pay | Admitting: Obstetrics & Gynecology

## 2018-11-13 DIAGNOSIS — R928 Other abnormal and inconclusive findings on diagnostic imaging of breast: Secondary | ICD-10-CM

## 2018-11-30 ENCOUNTER — Ambulatory Visit
Admission: RE | Admit: 2018-11-30 | Discharge: 2018-11-30 | Disposition: A | Discharge status: Home / Self Care | Payer: Commercial Managed Care - PPO | Source: Ambulatory Visit | Attending: Obstetrics & Gynecology | Admitting: Obstetrics & Gynecology

## 2018-11-30 DIAGNOSIS — R928 Other abnormal and inconclusive findings on diagnostic imaging of breast: Secondary | ICD-10-CM

## 2018-12-07 ENCOUNTER — Other Ambulatory Visit: Payer: Commercial Managed Care - PPO

## 2019-03-13 ENCOUNTER — Ambulatory Visit (HOSPITAL_COMMUNITY)
Admission: RE | Admit: 2019-03-13 | Discharge: 2019-03-13 | Disposition: A | Discharge status: Home / Self Care | Payer: Commercial Managed Care - PPO | Source: Ambulatory Visit | Attending: Urology | Admitting: Urology

## 2019-03-13 ENCOUNTER — Other Ambulatory Visit (HOSPITAL_COMMUNITY): Payer: Self-pay | Admitting: Urology

## 2019-03-13 ENCOUNTER — Other Ambulatory Visit: Payer: Self-pay

## 2019-03-13 DIAGNOSIS — C642 Malignant neoplasm of left kidney, except renal pelvis: Secondary | ICD-10-CM

## 2019-04-27 ENCOUNTER — Telehealth: Payer: Self-pay | Admitting: Gastroenterology

## 2019-04-27 NOTE — Telephone Encounter (Signed)
Pt has recall colon due in 2023. States she was diagnosed with kidney cancer in December and had part of her kidney removed. Pt calling to see if she needs to have colonoscopy done sooner thatn 2023 since she had kidney cancer. Please advise.

## 2019-04-27 NOTE — Telephone Encounter (Signed)
Patient call in wanting to speak with the nurse about maybe having to scheduling her colonoscopy early due to the fact she was recently diagnosed with kidney cancer. Please call and advised.

## 2019-04-30 NOTE — Telephone Encounter (Signed)
Spoke with pt and she is aware. 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

## 2019-04-30 NOTE — Telephone Encounter (Signed)
She would not need an earlier colonoscopy because of the kidney cancer.  However, her 2013 colonoscopy report indicates there is a family history of colon cancer, and recommended a repeat colonoscopy in 5 years.  Please inquire about her family history.  If she has a parent or sibling with a history of colon cancer, then she is due for a colonoscopy.

## 2019-05-22 ENCOUNTER — Other Ambulatory Visit: Payer: Self-pay | Admitting: Family Medicine

## 2019-05-25 ENCOUNTER — Other Ambulatory Visit: Payer: Self-pay | Admitting: Family Medicine

## 2019-05-25 DIAGNOSIS — R590 Localized enlarged lymph nodes: Secondary | ICD-10-CM

## 2019-05-30 ENCOUNTER — Other Ambulatory Visit: Payer: Self-pay | Admitting: Family Medicine

## 2019-05-30 DIAGNOSIS — R590 Localized enlarged lymph nodes: Secondary | ICD-10-CM

## 2019-06-15 ENCOUNTER — Other Ambulatory Visit: Payer: Self-pay

## 2019-06-15 ENCOUNTER — Other Ambulatory Visit: Payer: Self-pay | Admitting: Family Medicine

## 2019-06-15 ENCOUNTER — Ambulatory Visit
Admission: RE | Admit: 2019-06-15 | Discharge: 2019-06-15 | Disposition: A | Discharge status: Home / Self Care | Payer: Commercial Managed Care - PPO | Source: Ambulatory Visit | Attending: Family Medicine | Admitting: Family Medicine

## 2019-06-15 DIAGNOSIS — R590 Localized enlarged lymph nodes: Secondary | ICD-10-CM

## 2019-06-15 DIAGNOSIS — N644 Mastodynia: Secondary | ICD-10-CM

## 2019-10-07 IMAGING — CR CHEST - 2 VIEW
2 series · 2 of 2 positions shown · non-contrast
Comparison: 08/11/2018

CLINICAL DATA: 62-year-old female with a history of renal cell
carcinoma

EXAM:
CHEST - 2 VIEW

[w chest pa]
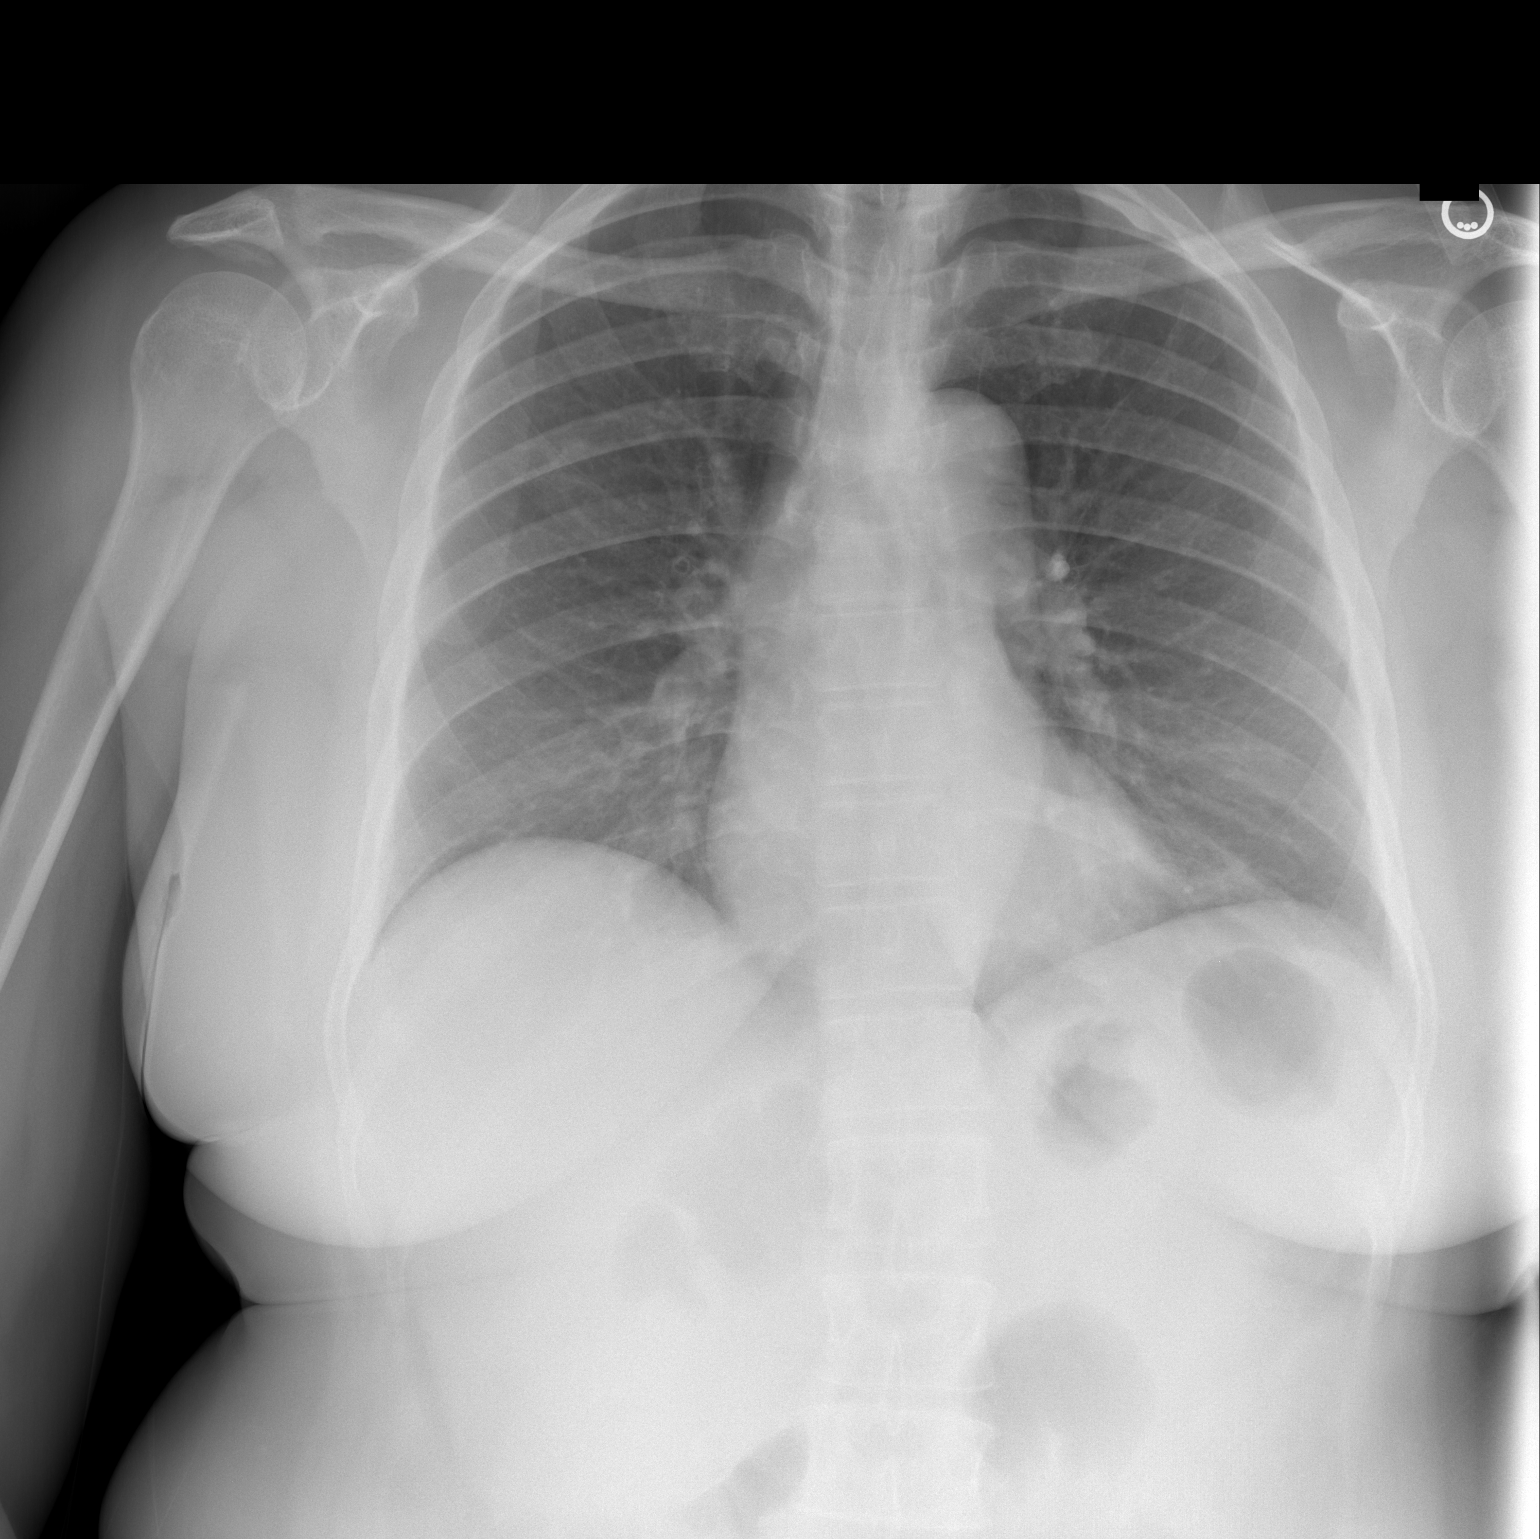

[w chest lat]
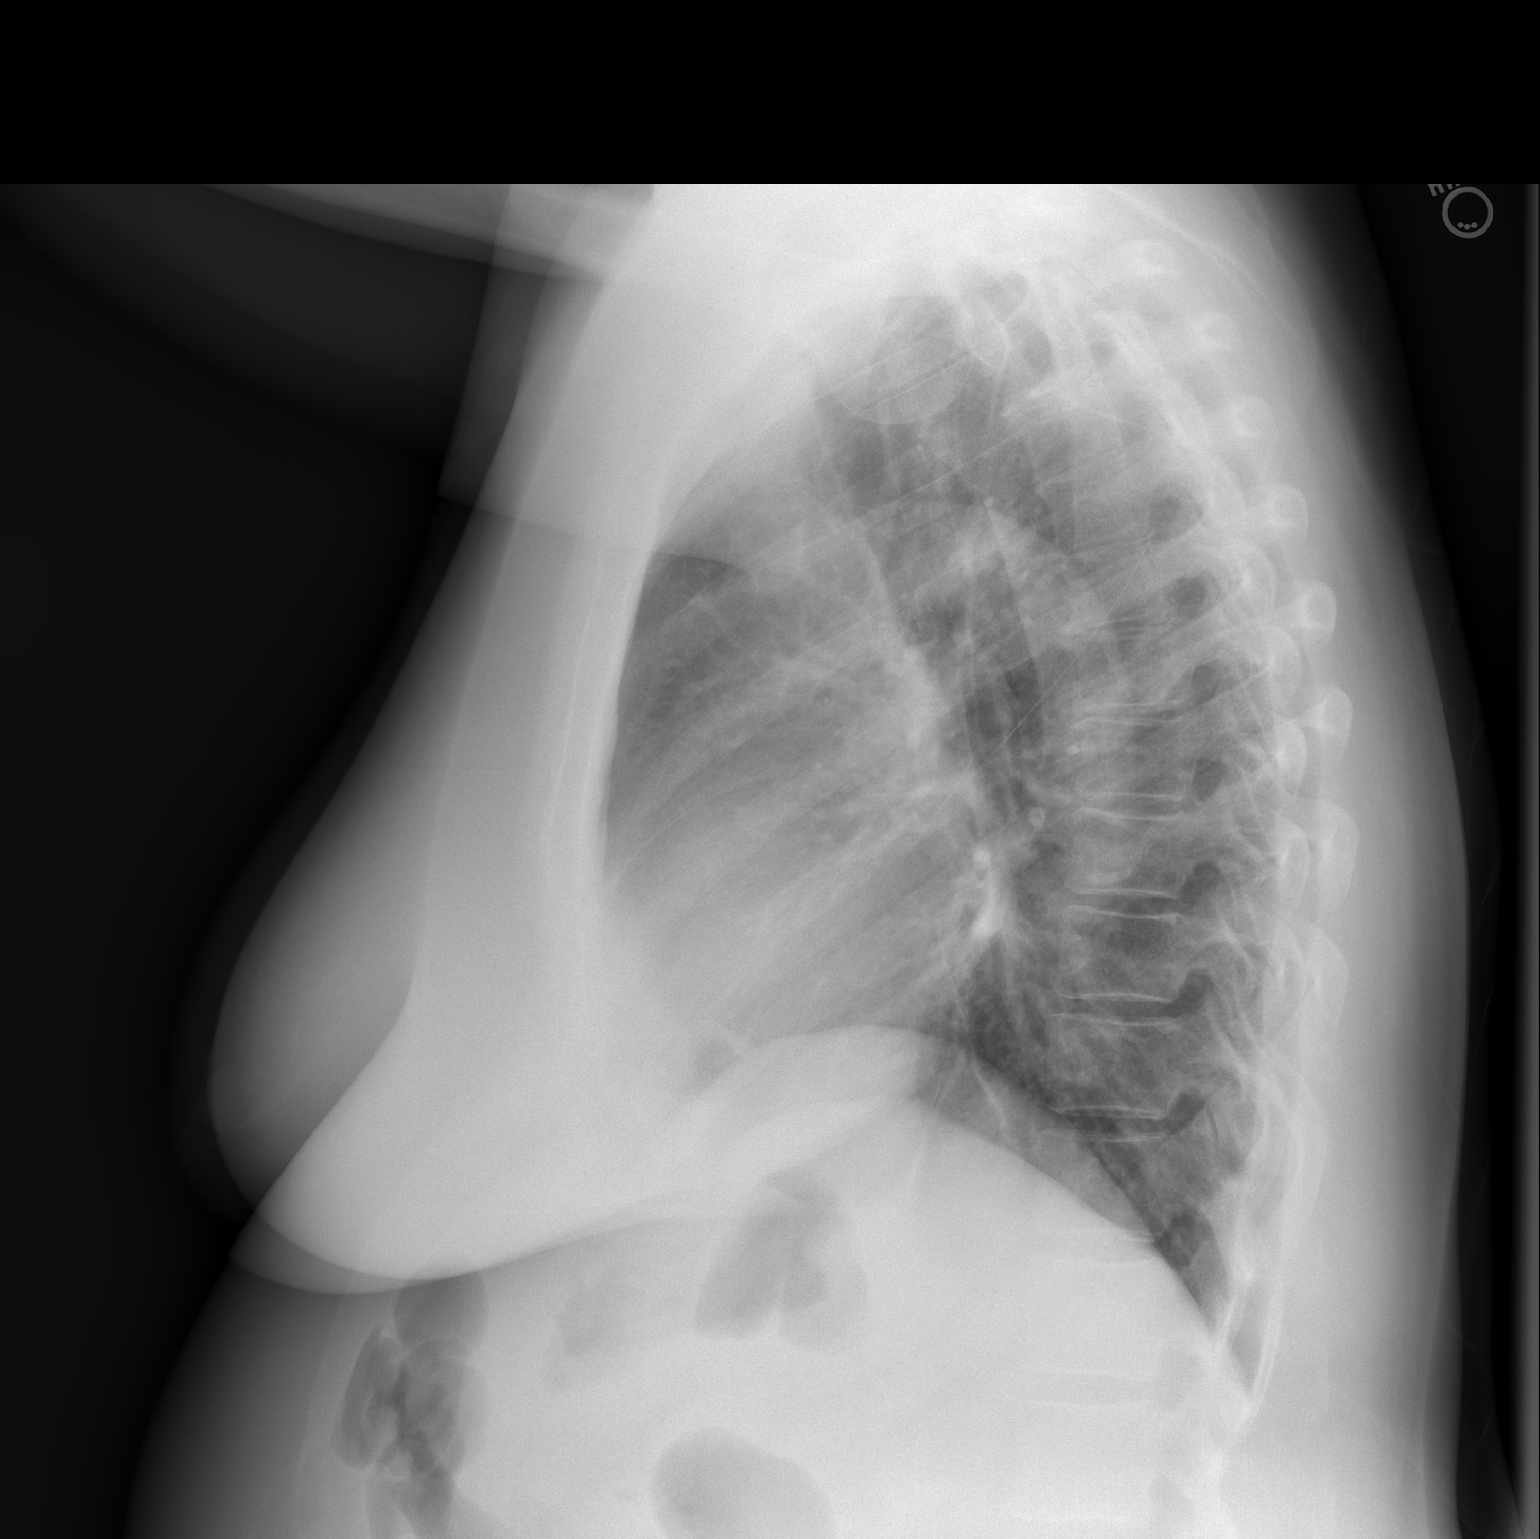

[2 of 2 positions shown; findings below may reference images not displayed]

FINDINGS: Cardiomediastinal silhouette within normal limits. No evidence of
central vascular congestion. No pneumothorax or pleural effusion. No
confluent airspace disease. No displaced fracture.
IMPRESSION: Negative for acute cardiopulmonary disease

## 2020-01-04 ENCOUNTER — Ambulatory Visit: Payer: Self-pay | Attending: Internal Medicine

## 2020-01-04 DIAGNOSIS — Z23 Encounter for immunization: Secondary | ICD-10-CM | POA: Insufficient documentation

## 2020-01-04 NOTE — Progress Notes (Signed)
   Covid-19 Vaccination Clinic  Name:  Janice Guerra    MRN: [id number] DOB: [birth year]  01/04/2020  [name] was observed post Covid-19 immunization for 15 minutes without incident. She was provided with Vaccine Information Sheet and instruction to access the V-Safe system.   [name] was instructed to call 911 with any severe reactions post vaccine: Marland Kitchen Difficulty breathing  . Swelling of face and throat  . A fast heartbeat  . A bad rash all over body  . Dizziness and weakness   Immunizations Administered    Name Date Dose VIS Date Route   Moderna COVID-19 Vaccine 01/04/2020 11:24 AM 0.5 mL 10/02/2019 Intramuscular   Manufacturer: Moderna   Lot: [id number]   [id number]

## 2020-01-09 IMAGING — MG DIGITAL DIAGNOSTIC UNILATERAL LEFT MAMMOGRAM WITH TOMO AND CAD
4 series · 4 of 12 positions shown · non-contrast
Comparison: Previous exam(s).

CLINICAL DATA: Left lateral breast pain.

EXAM:
DIGITAL DIAGNOSTIC LEFT MAMMOGRAM WITH CAD AND TOMO
ULTRASOUND LEFT BREAST

[L MLO synth-2D]
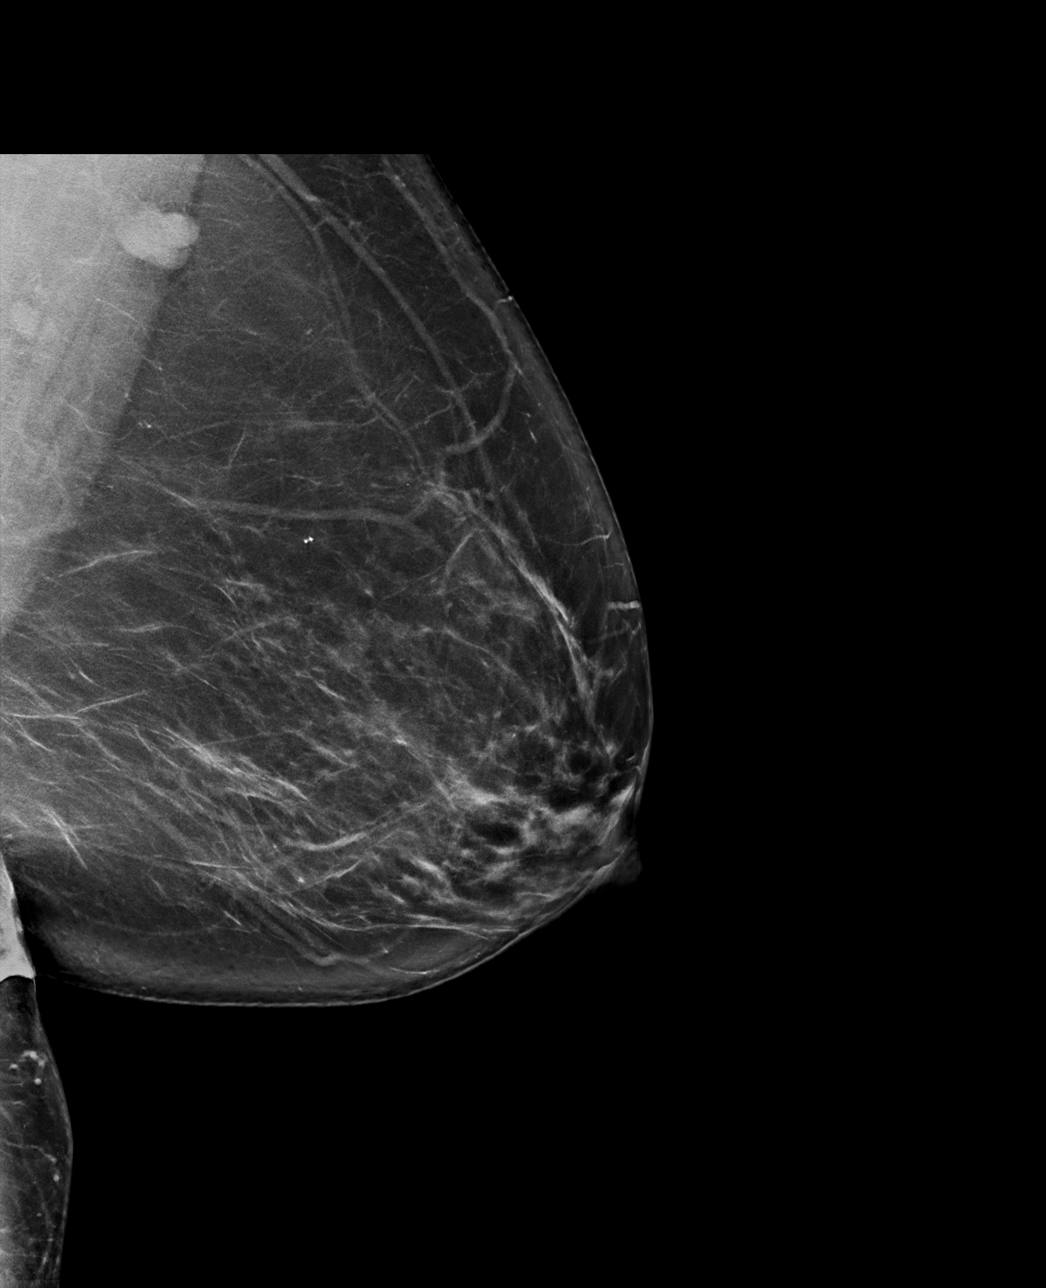

[L CC synth-2D]
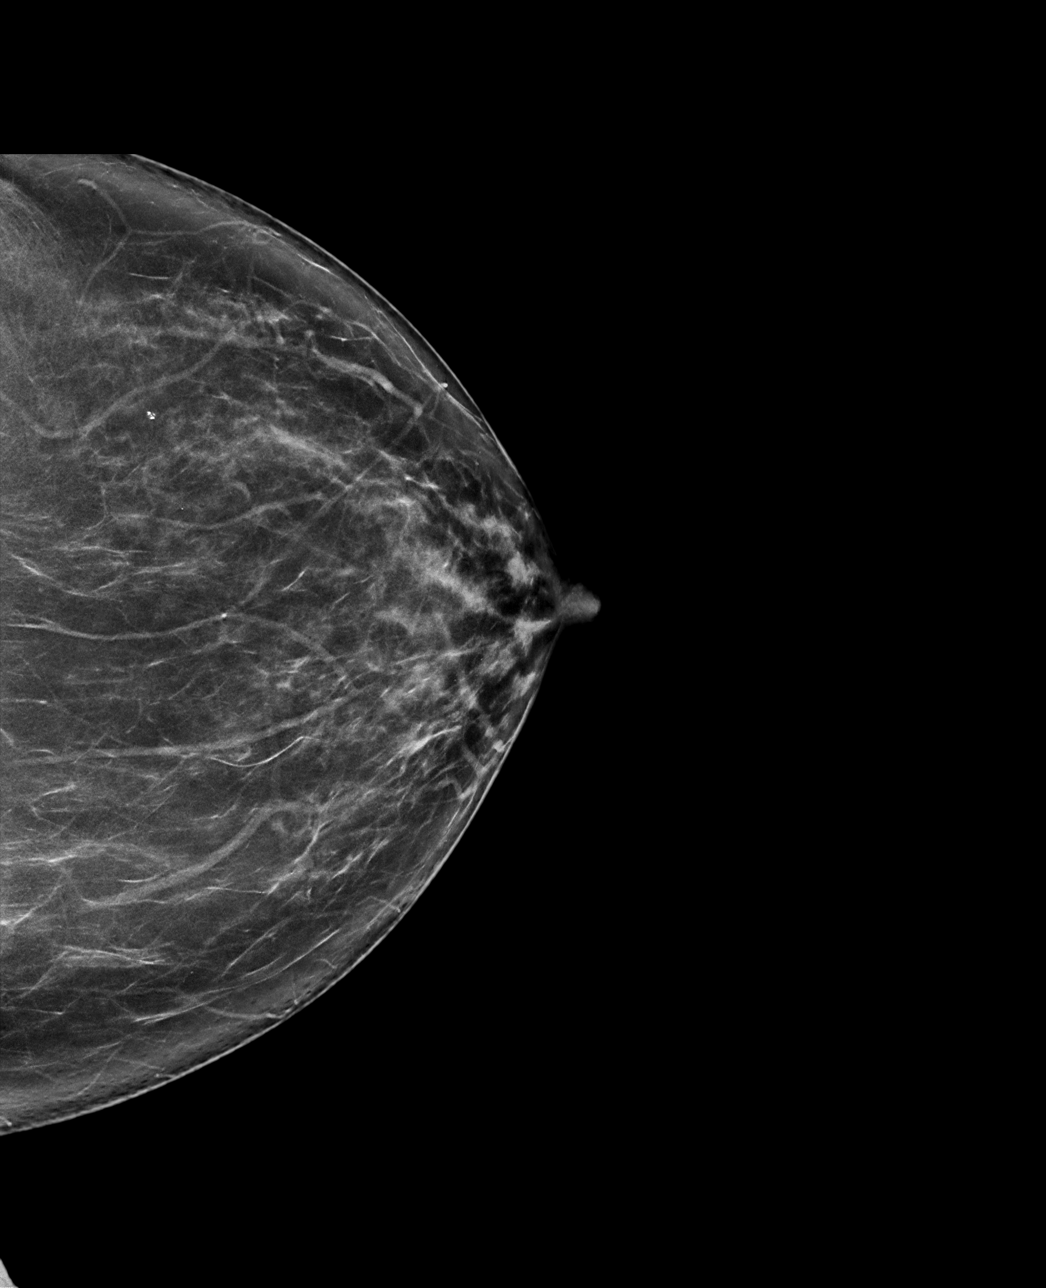

[L CC tomo · tomo slice 39/77.0]
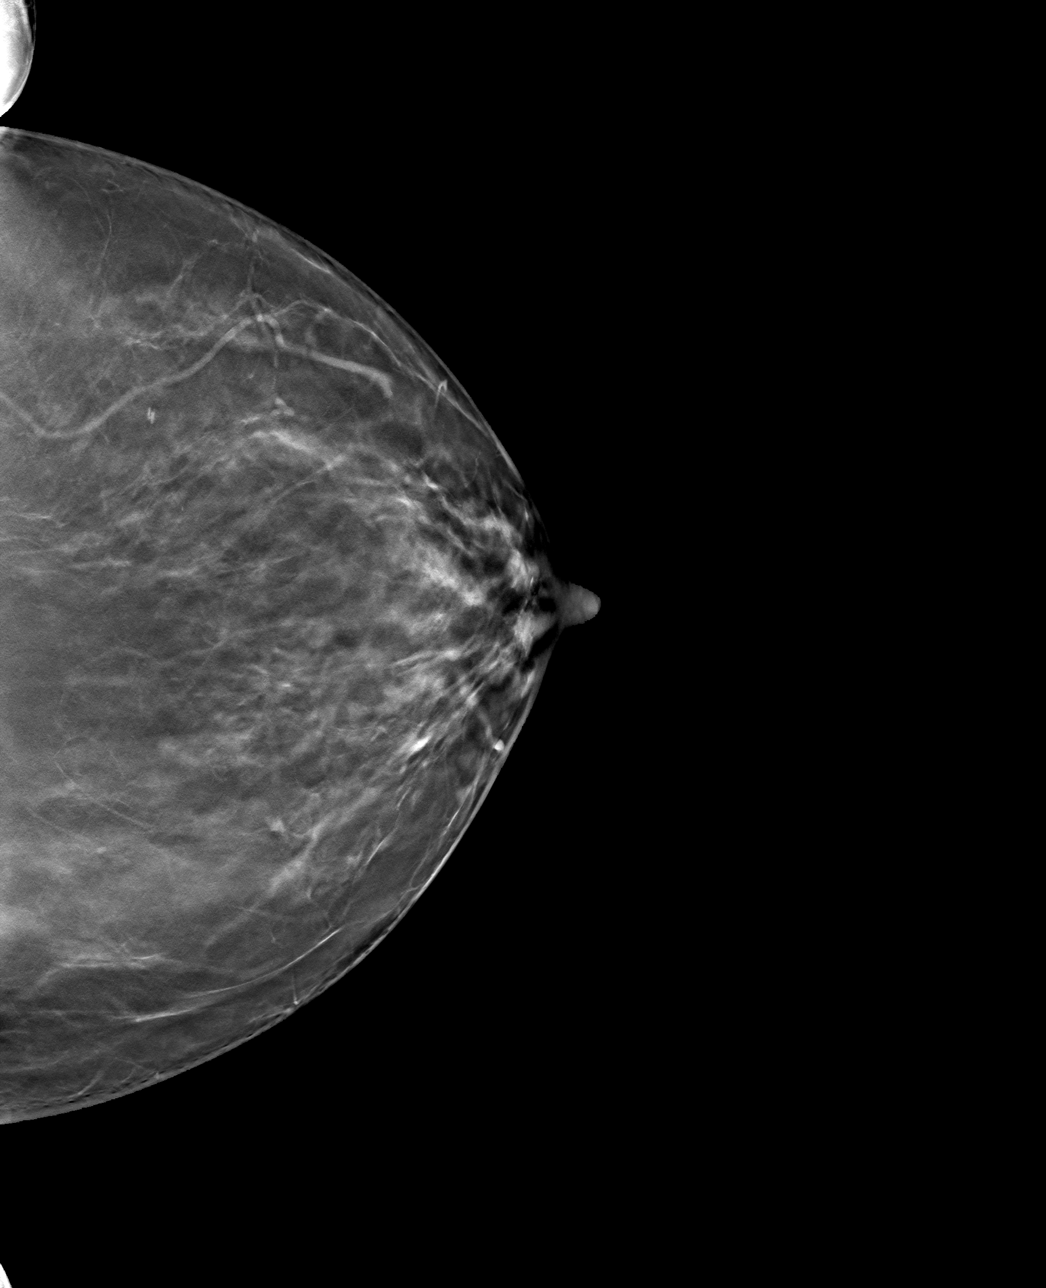

[L MLO tomo · tomo slice 46/91.0]
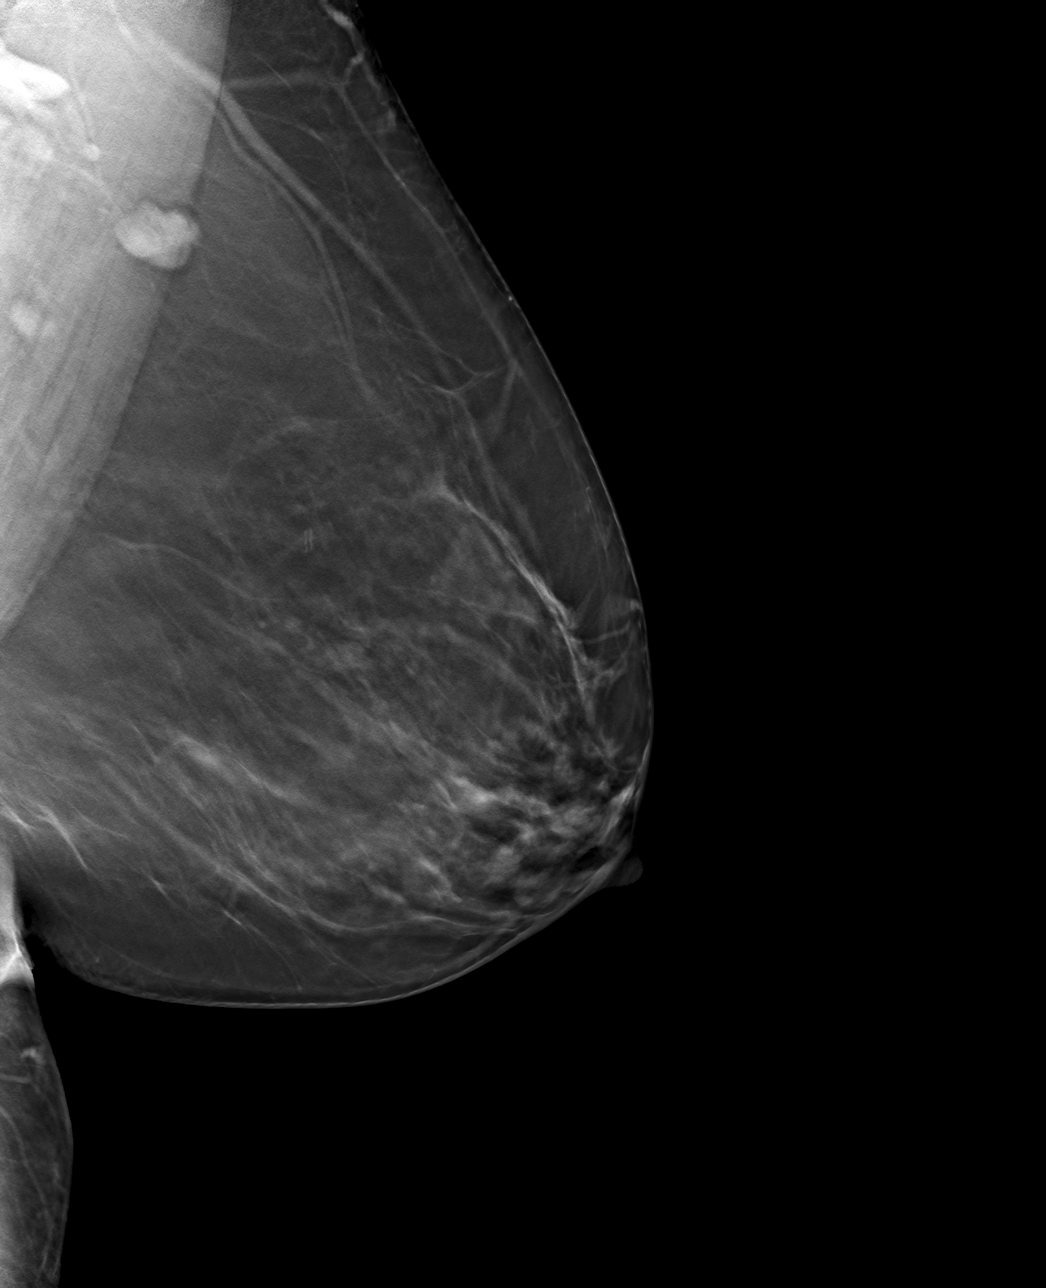

[4 of 12 positions shown; findings below may reference images not displayed]

ACR Breast Density Category b: There are scattered areas of
fibroglandular density.
FINDINGS: Mammographically, there are no suspicious masses, areas of
architectural distortion or microcalcifications in the left breast.
Stable benign-appearing left axillary tail/axillary lymph nodes are
noted.

Mammographic images were processed with CAD.

Targeted ultrasound is performed, showing no suspicious masses or
areas of architectural distortion at the area of pain in the left
lateral breast. Normal appearing left axillary tail/axillary lymph
nodes are again seen.
IMPRESSION: No mammographic or sonographic evidence of left breast malignancy.

RECOMMENDATION:
Further management of patient's left breast pain should be based on
clinical grounds.

Otherwise, the patient is due for a bilateral screening mammogram in
November 2019.

I have discussed the findings and recommendations with the patient.
Results were also provided in writing at the conclusion of the
visit. If applicable, a reminder letter will be sent to the patient
regarding the next appointment.

BI-RADS CATEGORY  1: Negative.

## 2020-02-06 ENCOUNTER — Ambulatory Visit: Payer: Self-pay | Attending: Internal Medicine

## 2020-02-06 DIAGNOSIS — Z23 Encounter for immunization: Secondary | ICD-10-CM

## 2020-02-06 NOTE — Progress Notes (Signed)
   Covid-19 Vaccination Clinic  Name:  Breanne Moad    MRN: [id number] DOB: Mar 01, 1956  02/06/2020  Ms. Liebling was observed post Covid-19 immunization for 15 minutes without incident. She was provided with Vaccine Information Sheet and instruction to access the V-Safe system.   Ms. Weich was instructed to call 911 with any severe reactions post vaccine: Marland Kitchen Difficulty breathing  . Swelling of face and throat  . A fast heartbeat  . A bad rash all over body  . Dizziness and weakness   Immunizations Administered    Name Date Dose VIS Date Route   Moderna COVID-19 Vaccine 02/06/2020 11:05 AM 0.5 mL 10/02/2019 Intramuscular   Manufacturer: Levan Hurst   [id number]   [id number]

## 2020-05-09 DIAGNOSIS — H1045 Other chronic allergic conjunctivitis: Secondary | ICD-10-CM | POA: Diagnosis not present

## 2020-07-16 ENCOUNTER — Other Ambulatory Visit: Payer: Self-pay

## 2020-07-16 ENCOUNTER — Other Ambulatory Visit (HOSPITAL_COMMUNITY): Payer: Self-pay | Admitting: Urology

## 2020-07-16 ENCOUNTER — Ambulatory Visit (HOSPITAL_COMMUNITY)
Admission: RE | Admit: 2020-07-16 | Discharge: 2020-07-16 | Disposition: A | Discharge status: Home / Self Care | Payer: BC Managed Care – PPO | Source: Ambulatory Visit | Attending: Urology | Admitting: Urology

## 2020-07-16 DIAGNOSIS — C642 Malignant neoplasm of left kidney, except renal pelvis: Secondary | ICD-10-CM | POA: Diagnosis not present

## 2020-07-18 ENCOUNTER — Other Ambulatory Visit: Payer: Self-pay | Admitting: Family Medicine

## 2020-07-18 DIAGNOSIS — Z1231 Encounter for screening mammogram for malignant neoplasm of breast: Secondary | ICD-10-CM

## 2020-07-24 DIAGNOSIS — C642 Malignant neoplasm of left kidney, except renal pelvis: Secondary | ICD-10-CM | POA: Diagnosis not present

## 2020-08-01 ENCOUNTER — Other Ambulatory Visit: Payer: Self-pay

## 2020-08-01 ENCOUNTER — Ambulatory Visit
Admission: RE | Admit: 2020-08-01 | Discharge: 2020-08-01 | Disposition: A | Discharge status: Home / Self Care | Payer: BC Managed Care – PPO | Source: Ambulatory Visit | Attending: Family Medicine | Admitting: Family Medicine

## 2020-08-01 DIAGNOSIS — Z1231 Encounter for screening mammogram for malignant neoplasm of breast: Secondary | ICD-10-CM

## 2020-09-11 DIAGNOSIS — C642 Malignant neoplasm of left kidney, except renal pelvis: Secondary | ICD-10-CM | POA: Diagnosis not present

## 2020-09-11 DIAGNOSIS — N1330 Unspecified hydronephrosis: Secondary | ICD-10-CM | POA: Diagnosis not present

## 2020-09-11 DIAGNOSIS — C649 Malignant neoplasm of unspecified kidney, except renal pelvis: Secondary | ICD-10-CM | POA: Diagnosis not present

## 2020-09-11 DIAGNOSIS — K439 Ventral hernia without obstruction or gangrene: Secondary | ICD-10-CM | POA: Diagnosis not present

## 2020-09-11 DIAGNOSIS — N200 Calculus of kidney: Secondary | ICD-10-CM | POA: Diagnosis not present

## 2020-10-09 DIAGNOSIS — Z6837 Body mass index (BMI) 37.0-37.9, adult: Secondary | ICD-10-CM | POA: Diagnosis not present

## 2020-10-09 DIAGNOSIS — Z01419 Encounter for gynecological examination (general) (routine) without abnormal findings: Secondary | ICD-10-CM | POA: Diagnosis not present

## 2020-10-22 DIAGNOSIS — K439 Ventral hernia without obstruction or gangrene: Secondary | ICD-10-CM | POA: Diagnosis not present

## 2020-12-04 ENCOUNTER — Other Ambulatory Visit: Payer: Self-pay | Admitting: Surgery

## 2020-12-04 DIAGNOSIS — K432 Incisional hernia without obstruction or gangrene: Secondary | ICD-10-CM | POA: Diagnosis not present

## 2021-02-11 ENCOUNTER — Other Ambulatory Visit: Payer: Self-pay

## 2021-02-11 ENCOUNTER — Encounter (HOSPITAL_BASED_OUTPATIENT_CLINIC_OR_DEPARTMENT_OTHER): Payer: Self-pay | Admitting: Surgery

## 2021-02-11 NOTE — Progress Notes (Signed)
Spoke w/ via phone for pre-op interview---pt Lab needs dos---- I stat, ekg  (per anesthesia) surgery orders pending            Lab results------none COVID test ------02-17-2021 255 pm Arrive at -------700 am 02-19-2021 NPO after MN NO Solid Food.  Clear liquids from MN until---600 am then npo Med rec completed Medications to take morning of surgery -----restasis eye drop Diabetic medication -----n/a Patient instructed to bring photo id and insurance card day of surgery Patient aware to have Driver (ride ) / caregiver spouse greg will stay    for 24 hours after surgery  Patient Special Instructions -----requested surgery orders to dr d blackman epic inbasket Pre-Op special Istructions -----none Patient verbalized understanding of instructions that were given at this phone interview. Patient denies shortness of breath, chest pain, fever, cough at this phone interview.

## 2021-02-15 ENCOUNTER — Other Ambulatory Visit: Payer: Self-pay | Admitting: Surgery

## 2021-02-16 ENCOUNTER — Other Ambulatory Visit (HOSPITAL_COMMUNITY): Payer: BC Managed Care – PPO

## 2021-02-17 ENCOUNTER — Other Ambulatory Visit (HOSPITAL_COMMUNITY)
Admission: RE | Admit: 2021-02-17 | Discharge: 2021-02-17 | Disposition: A | Discharge status: Home / Self Care | Payer: BC Managed Care – PPO | Source: Ambulatory Visit | Attending: Surgery | Admitting: Surgery

## 2021-02-17 DIAGNOSIS — Z905 Acquired absence of kidney: Secondary | ICD-10-CM | POA: Diagnosis not present

## 2021-02-17 DIAGNOSIS — Z01812 Encounter for preprocedural laboratory examination: Secondary | ICD-10-CM | POA: Insufficient documentation

## 2021-02-17 DIAGNOSIS — Z20822 Contact with and (suspected) exposure to covid-19: Secondary | ICD-10-CM | POA: Insufficient documentation

## 2021-02-17 DIAGNOSIS — K432 Incisional hernia without obstruction or gangrene: Secondary | ICD-10-CM | POA: Diagnosis not present

## 2021-02-17 LAB — SARS CORONAVIRUS 2 (TAT 6-24 HRS): SARS Coronavirus 2: NEGATIVE

## 2021-02-18 NOTE — H&P (Signed)
Janice Guerra  Location: [facility] Patient #: [id number] DOB: 04/24/1956 Undefined / Language: Janice Guerra / Race: Black or African American Female   History of Present Illness The patient is a 65 year old female who presents with an incisional hernia.  Chief complaint: Incisional hernia  This is a very pleasant 65 year old female who has had a previous robotic-assisted partial nephrectomy who noticed pain in her left upper quadrant near her incision last summer. This is while she was bending. She noticed pain and nausea with this. She still have some intermittent pain with activities but no other obstructive symptoms.   Past Surgical History Malachi Bonds, CMA Nephrectomy  Left. Thyroid Surgery  Ventral / Umbilical Hernia Surgery  Left.  Diagnostic Studies History Malachi Bonds, CMA; Colonoscopy  5-10 years ago Mammogram  within last year Pap Smear  1-5 years ago  Allergies Malachi Bonds, CMA Penicillins   Medication History Malachi Bonds, CMA;  Losartan Potassium (100MG  Tablet, Oral) Active. Medications Reconciled  Social History Malachi Bonds, CMA; Caffeine use  Carbonated beverages, Coffee, Tea. No alcohol use  No drug use  Tobacco use  Never smoker.  Family History Malachi Bonds, CMA; Alcohol Abuse  Brother. Arthritis  Brother, Mother. Janice Polyps  Father, Sister. Heart Disease  Father. Hypertension  Brother.  Pregnancy / Birth History Malachi Bonds, CMA; Age at menarche  3 years. Age of menopause  41-60 Contraceptive History  Oral contraceptives. Gravida  0 Para  0  Other Problems Malachi Bonds, CMA; Back Pain  Cancer  Hemorrhoids  High blood pressure  Kidney Stone  Ventral Hernia Repair     Review of Systems (Janice Guerra CMA; General Not Present- Appetite Loss, Chills, Fatigue, Fever, Night Sweats, Weight Gain and Weight Loss. Skin Not Present- Change in Wart/Mole, Dryness, Hives,  Jaundice, New Lesions, Non-Healing Wounds, Rash and Ulcer. HEENT Not Present- Earache, Hearing Loss, Hoarseness, Nose Bleed, Oral Ulcers, Ringing in the Ears, Seasonal Allergies, Sinus Pain, Sore Throat, Visual Disturbances, Wears glasses/contact lenses and Yellow Eyes. Respiratory Not Present- Bloody sputum, Chronic Cough, Difficulty Breathing, Snoring and Wheezing. Breast Not Present- Breast Mass, Breast Pain, Nipple Discharge and Skin Changes. Cardiovascular Not Present- Chest Pain, Difficulty Breathing Lying Down, Leg Cramps, Palpitations, Rapid Heart Rate, Shortness of Breath and Swelling of Extremities. Gastrointestinal Present- Abdominal Pain. Not Present- Bloating, Bloody Stool, Change in Bowel Habits, Chronic diarrhea, Constipation, Difficulty Swallowing, Excessive gas, Gets full quickly at meals, Hemorrhoids, Indigestion, Nausea, Rectal Pain and Vomiting. Female Genitourinary Present- Frequency. Not Present- Nocturia, Painful Urination, Pelvic Pain and Urgency. Neurological Not Present- Decreased Memory, Fainting, Headaches, Numbness, Seizures, Tingling, Tremor, Trouble walking and Weakness. Psychiatric Not Present- Anxiety, Bipolar, Change in Sleep Pattern, Depression, Fearful and Frequent crying. Endocrine Not Present- Cold Intolerance, Excessive Hunger, Hair Changes, Heat Intolerance, Hot flashes and New Diabetes. Hematology Not Present- Blood Thinners, Easy Bruising, Excessive bleeding, Gland problems, HIV and Persistent Infections.  Vitals   Weight: 236.8 lb Height: 67.5in Body Surface Area: 2.18 m Body Mass Index: 36.54 kg/m      Physical Exam The physical exam findings are as follows: Note: She appears well exam  Her abdomen is soft. She has multiple small well-healed incisions. There is a chronically incarcerated hernia at the robotic port site just to the left of the midline which is minimally tender and likely contains incarcerated omentum  I reviewed the CT  scan showing the hernia containing fat with no bowel involvement    Assessment & Plan   INCISIONAL HERNIA (  K43.2)  Impression: I reviewed her notes in the electronic medical records and reviewed her CT scan  She has a small incisional hernia containing omentum at the robotic trocar site. I discussed this with the patient in detail. We discussed abdominal wall anatomy. I discussed hernia repair with mesh. We discussed both the laparoscopic and open techniques. I believe the fascial defect is small and this can be repaired with an open hernia repair with mesh as an outpatient. I discussed this with her in detail. We discussed the risk which includes but is not limited to bleeding, infection, use of mesh, hernia recurrence, postoperative recovery, etc.

## 2021-02-19 ENCOUNTER — Other Ambulatory Visit: Payer: Self-pay

## 2021-02-19 ENCOUNTER — Ambulatory Visit (HOSPITAL_BASED_OUTPATIENT_CLINIC_OR_DEPARTMENT_OTHER)
Admission: RE | Admit: 2021-02-19 | Discharge: 2021-02-19 | Disposition: A | Discharge status: Home / Self Care | Payer: BC Managed Care – PPO | Attending: Surgery | Admitting: Surgery

## 2021-02-19 ENCOUNTER — Encounter (HOSPITAL_BASED_OUTPATIENT_CLINIC_OR_DEPARTMENT_OTHER)
Admission: RE | Disposition: A | Discharge status: Home / Self Care | Payer: Self-pay | Source: Home / Self Care | Attending: Surgery

## 2021-02-19 ENCOUNTER — Ambulatory Visit (HOSPITAL_BASED_OUTPATIENT_CLINIC_OR_DEPARTMENT_OTHER): Payer: BC Managed Care – PPO | Admitting: Anesthesiology

## 2021-02-19 ENCOUNTER — Encounter (HOSPITAL_BASED_OUTPATIENT_CLINIC_OR_DEPARTMENT_OTHER): Payer: Self-pay | Admitting: Surgery

## 2021-02-19 DIAGNOSIS — N201 Calculus of ureter: Secondary | ICD-10-CM | POA: Diagnosis not present

## 2021-02-19 DIAGNOSIS — N2889 Other specified disorders of kidney and ureter: Secondary | ICD-10-CM | POA: Diagnosis not present

## 2021-02-19 DIAGNOSIS — Z905 Acquired absence of kidney: Secondary | ICD-10-CM | POA: Insufficient documentation

## 2021-02-19 DIAGNOSIS — Z20822 Contact with and (suspected) exposure to covid-19: Secondary | ICD-10-CM | POA: Diagnosis not present

## 2021-02-19 DIAGNOSIS — K432 Incisional hernia without obstruction or gangrene: Secondary | ICD-10-CM | POA: Insufficient documentation

## 2021-02-19 DIAGNOSIS — I10 Essential (primary) hypertension: Secondary | ICD-10-CM | POA: Diagnosis not present

## 2021-02-19 HISTORY — DX: Anesthesia of skin: R20.0

## 2021-02-19 HISTORY — DX: Presence of spectacles and contact lenses: Z97.3

## 2021-02-19 HISTORY — DX: Incisional hernia without obstruction or gangrene: K43.2

## 2021-02-19 HISTORY — PX: INCISIONAL HERNIA REPAIR: SHX193

## 2021-02-19 HISTORY — DX: Zoster without complications: B02.9

## 2021-02-19 LAB — POCT I-STAT, CHEM 8
BUN: 14 mg/dL (ref 8–23)
Calcium, Ion: 1.09 mmol/L — ABNORMAL LOW (ref 1.15–1.40)
Chloride: 102 mmol/L (ref 98–111)
Creatinine, Ser: 0.6 mg/dL (ref 0.44–1.00)
Glucose, Bld: 114 mg/dL — ABNORMAL HIGH (ref 70–99)
HCT: 42 % (ref 36.0–46.0)
Hemoglobin: 14.3 g/dL (ref 12.0–15.0)
Potassium: 3.7 mmol/L (ref 3.5–5.1)
Sodium: 139 mmol/L (ref 135–145)
TCO2: 26 mmol/L (ref 22–32)

## 2021-02-19 SURGERY — REPAIR, HERNIA, INCISIONAL
Anesthesia: General | Site: Abdomen

## 2021-02-19 MED ORDER — FENTANYL CITRATE (PF) 100 MCG/2ML IJ SOLN
INTRAMUSCULAR | Status: AC
  Filled 2021-02-19: qty 2

## 2021-02-19 MED ORDER — DEXAMETHASONE SODIUM PHOSPHATE 10 MG/ML IJ SOLN
INTRAMUSCULAR | Status: DC | PRN
  Administered 2021-02-19: 5 mg via INTRAVENOUS

## 2021-02-19 MED ORDER — ACETAMINOPHEN 500 MG PO TABS
1000.0000 mg | ORAL_TABLET | ORAL | Status: AC
  Administered 2021-02-19: 1000 mg via ORAL

## 2021-02-19 MED ORDER — CIPROFLOXACIN IN D5W 400 MG/200ML IV SOLN
INTRAVENOUS | Status: AC
  Filled 2021-02-19: qty 200

## 2021-02-19 MED ORDER — CIPROFLOXACIN IN D5W 400 MG/200ML IV SOLN
400.0000 mg | INTRAVENOUS | Status: AC
  Administered 2021-02-19: 400 mg via INTRAVENOUS

## 2021-02-19 MED ORDER — LIDOCAINE 2% (20 MG/ML) 5 ML SYRINGE
INTRAMUSCULAR | Status: DC | PRN
  Administered 2021-02-19: 80 mg via INTRAVENOUS

## 2021-02-19 MED ORDER — MIDAZOLAM HCL 5 MG/5ML IJ SOLN
INTRAMUSCULAR | Status: DC | PRN
  Administered 2021-02-19: 2 mg via INTRAVENOUS

## 2021-02-19 MED ORDER — MIDAZOLAM HCL 2 MG/2ML IJ SOLN
INTRAMUSCULAR | Status: AC
  Filled 2021-02-19: qty 2

## 2021-02-19 MED ORDER — ONDANSETRON HCL 4 MG/2ML IJ SOLN
INTRAMUSCULAR | Status: AC
  Filled 2021-02-19: qty 2

## 2021-02-19 MED ORDER — SODIUM CHLORIDE 0.9 % IV SOLN: INTRAVENOUS | Status: DC

## 2021-02-19 MED ORDER — BUPIVACAINE-EPINEPHRINE 0.5% -1:200000 IJ SOLN
INTRAMUSCULAR | Status: DC | PRN
  Administered 2021-02-19: 20 mL

## 2021-02-19 MED ORDER — CHLORHEXIDINE GLUCONATE CLOTH 2 % EX PADS: 6.0000 | MEDICATED_PAD | Freq: Once | CUTANEOUS | Status: DC

## 2021-02-19 MED ORDER — PROPOFOL 10 MG/ML IV BOLUS
INTRAVENOUS | Status: DC | PRN
  Administered 2021-02-19: 170 mg via INTRAVENOUS

## 2021-02-19 MED ORDER — ONDANSETRON HCL 4 MG/2ML IJ SOLN
INTRAMUSCULAR | Status: DC | PRN
  Administered 2021-02-19: 4 mg via INTRAVENOUS

## 2021-02-19 MED ORDER — DEXAMETHASONE SODIUM PHOSPHATE 10 MG/ML IJ SOLN
INTRAMUSCULAR | Status: AC
  Filled 2021-02-19: qty 1

## 2021-02-19 MED ORDER — PROPOFOL 10 MG/ML IV BOLUS
INTRAVENOUS | Status: AC
  Filled 2021-02-19: qty 20

## 2021-02-19 MED ORDER — ENSURE PRE-SURGERY PO LIQD: 296.0000 mL | Freq: Once | ORAL | Status: DC

## 2021-02-19 MED ORDER — FENTANYL CITRATE (PF) 100 MCG/2ML IJ SOLN
INTRAMUSCULAR | Status: DC | PRN
  Administered 2021-02-19 (×3): 50 ug via INTRAVENOUS

## 2021-02-19 MED ORDER — OXYCODONE HCL 5 MG PO TABS: 5.0000 mg | ORAL_TABLET | Freq: Four times a day (QID) | ORAL | 0 refills | Status: DC | PRN

## 2021-02-19 MED ORDER — ACETAMINOPHEN 500 MG PO TABS
ORAL_TABLET | ORAL | Status: AC
  Filled 2021-02-19: qty 2

## 2021-02-19 MED ORDER — LIDOCAINE 2% (20 MG/ML) 5 ML SYRINGE
INTRAMUSCULAR | Status: AC
  Filled 2021-02-19: qty 5

## 2021-02-19 MED ORDER — 0.9 % SODIUM CHLORIDE (POUR BTL) OPTIME
TOPICAL | Status: DC | PRN
  Administered 2021-02-19: 500 mL

## 2021-02-19 SURGICAL SUPPLY — 41 items
ADH SKN CLS APL DERMABOND .7 (GAUZE/BANDAGES/DRESSINGS) ×1
APL PRP STRL LF DISP 70% ISPRP (MISCELLANEOUS) ×1
BLADE CLIPPER SENSICLIP SURGIC (BLADE) IMPLANT
BLADE HEX COATED 2.75 (ELECTRODE) ×2 IMPLANT
BLADE SURG 15 STRL LF DISP TIS (BLADE) ×1 IMPLANT
BLADE SURG 15 STRL SS (BLADE) ×2
CHLORAPREP W/TINT 26 (MISCELLANEOUS) ×2 IMPLANT
COVER BACK TABLE 60X90IN (DRAPES) ×2 IMPLANT
COVER MAYO STAND STRL (DRAPES) ×2 IMPLANT
COVER WAND RF STERILE (DRAPES) ×2 IMPLANT
DECANTER SPIKE VIAL GLASS SM (MISCELLANEOUS) IMPLANT
DERMABOND ADVANCED (GAUZE/BANDAGES/DRESSINGS) ×1
DERMABOND ADVANCED .7 DNX12 (GAUZE/BANDAGES/DRESSINGS) ×1 IMPLANT
DRAPE LAPAROTOMY 100X72 PEDS (DRAPES) ×2 IMPLANT
DRAPE UTILITY XL STRL (DRAPES) ×2 IMPLANT
DRSG TEGADERM 2-3/8X2-3/4 SM (GAUZE/BANDAGES/DRESSINGS) IMPLANT
ELECT REM PT RETURN 9FT ADLT (ELECTROSURGICAL) ×2
ELECTRODE REM PT RTRN 9FT ADLT (ELECTROSURGICAL) ×1 IMPLANT
GLOVE SURG PR MICRO ENCORE 7.5 (GLOVE) ×2 IMPLANT
GOWN STRL REUS W/TWL LRG LVL3 (GOWN DISPOSABLE) ×4 IMPLANT
KIT TURNOVER CYSTO (KITS) ×2 IMPLANT
LIGASURE IMPACT 36 18CM CVD LR (INSTRUMENTS) IMPLANT
MESH VENTRALEX ST 2.5 CRC MED (Mesh General) ×2 IMPLANT
NEEDLE HYPO 25X1 1.5 SAFETY (NEEDLE) ×2 IMPLANT
NS IRRIG 1000ML POUR BTL (IV SOLUTION) IMPLANT
PACK BASIN DAY SURGERY FS (CUSTOM PROCEDURE TRAY) ×2 IMPLANT
PENCIL SMOKE EVACUATOR (MISCELLANEOUS) ×2 IMPLANT
SLEEVE SCD COMPRESS KNEE MED (STOCKING) ×2 IMPLANT
SPONGE LAP 4X18 RFD (DISPOSABLE) IMPLANT
SUT MNCRL AB 4-0 PS2 18 (SUTURE) ×2 IMPLANT
SUT NOVA 0 T19/GS 22DT (SUTURE) IMPLANT
SUT NOVA NAB DX-16 0-1 5-0 T12 (SUTURE) ×2 IMPLANT
SUT NOVA NAB GS-21 1 T12 (SUTURE) IMPLANT
SUT VIC AB 2-0 SH 27 (SUTURE)
SUT VIC AB 2-0 SH 27XBRD (SUTURE) IMPLANT
SUT VIC AB 3-0 SH 27 (SUTURE) ×2
SUT VIC AB 3-0 SH 27X BRD (SUTURE) ×1 IMPLANT
SYR CONTROL 10ML LL (SYRINGE) ×2 IMPLANT
TOWEL OR 17X26 10 PK STRL BLUE (TOWEL DISPOSABLE) ×2 IMPLANT
TUBE CONNECTING 12X1/4 (SUCTIONS) IMPLANT
YANKAUER SUCT BULB TIP NO VENT (SUCTIONS) IMPLANT

## 2021-02-19 NOTE — Interval H&P Note (Signed)
History and Physical Interval Note:no change in H and P  02/19/2021 8:23 AM  Janice Guerra  has presented today for surgery, with the diagnosis of INCISIONAL HERNIA.  The various methods of treatment have been discussed with the patient and family. After consideration of risks, benefits and other options for treatment, the patient has consented to  Procedure(s): [place] (N/A) as a surgical intervention.  The patient's history has been reviewed, patient examined, no change in status, stable for surgery.  I have reviewed the patient's chart and labs.  Questions were answered to the patient's satisfaction.     Coralie Keens

## 2021-02-19 NOTE — Discharge Instructions (Signed)
[facility]  UMBILICAL OR INGUINAL HERNIA REPAIR: POST OP INSTRUCTIONS  Always review your discharge instruction sheet given to you by the facility where your surgery was performed. IF YOU HAVE DISABILITY OR FAMILY LEAVE FORMS, YOU MUST BRING THEM TO THE OFFICE FOR PROCESSING.   DO NOT GIVE THEM TO YOUR DOCTOR.  1. A  prescription for pain medication may be given to you upon discharge.  Take your pain medication as prescribed, if needed.  If narcotic pain medicine is not needed, then you may take acetaminophen (Tylenol) or ibuprofen (Advil) as needed. 2. Take your usually prescribed medications unless otherwise directed. If you need a refill on your pain medication, please contact your pharmacy.  They will contact our office to request authorization. Prescriptions will not be filled after 5 pm or on week-ends. 3. You should follow a light diet the first 24 hours after arrival home, such as soup and crackers, etc.  Be sure to include lots of fluids daily.  Resume your normal diet the day after surgery. 4.Most patients will experience some swelling and bruising around the umbilicus or in the groin and scrotum.  Ice packs and reclining will help.  Swelling and bruising can take several days to resolve.  6. It is common to experience some constipation if taking pain medication after surgery.  Increasing fluid intake and taking a stool softener (such as Colace) will usually help or prevent this problem from occurring.  A mild laxative (Milk of Magnesia or Miralax) should be taken according to package directions if there are no bowel movements after 48 hours. 7. Unless discharge instructions indicate otherwise, you may remove your bandages 24-48 hours after surgery, and you may shower at that time.  You may have steri-strips (small skin tapes) in place directly over the incision.  These strips should be left on the skin for 7-10 days.  If your surgeon used skin glue on the  incision, you may shower in 24 hours.  The glue will flake off over the next 2-3 weeks.  Any sutures or staples will be removed at the office during your follow-up visit. 8. ACTIVITIES:  You may resume regular (light) daily activities beginning the next day--such as daily self-care, walking, climbing stairs--gradually increasing activities as tolerated.  You may have sexual intercourse when it is comfortable.  Refrain from any heavy lifting or straining until approved by your doctor.  a.You may drive when you are no longer taking prescription pain medication, you can comfortably wear a seatbelt, and you can safely maneuver your car and apply brakes. b.RETURN TO WORK:  PATIENT WILL NEED TO BE OUT OF WORK FOR 2 1/2 WEEKS.   _____________________________________________  9.You should see your doctor in the office for a follow-up appointment approximately 2-3 weeks after your surgery.  Make sure that you call for this appointment within a day or two after you arrive home to insure a convenient appointment time. 10.OTHER INSTRUCTIONS: _OK TO SHOWER STARTING TOMORROW ICE PACK, TYLENOL, AND IBUPROFEN ALSO FOR PAIN NO LIFTING MORE THAN 15 TO 20 POUNDS FOR 4 WEEKS________________________    _____________________________________  WHEN TO CALL YOUR DOCTOR: 1. Fever over 101.0 2. Inability to urinate 3. Nausea and/or vomiting 4. Extreme swelling or bruising 5. Continued bleeding from incision. 6. Increased pain, redness, or drainage from the incision  The clinic staff is available to answer your questions during regular business hours.  Please don't hesitate to call and ask to speak to one of the  nurses for clinical concerns.  If you have a medical emergency, go to the nearest emergency room or call 911.  A surgeon from [facility] is always on call at the hospital   [address], [place], [place], [place]  [address] ?  [address], [place], [place]   [address] [phone] ?  [phone] ? FAX [phone] Web site: [email or web address]   Post Anesthesia Home Care Instructions  Activity: Get plenty of rest for the remainder of the day. A responsible individual must stay with you for 24 hours following the procedure.  For the next 24 hours, DO NOT: -Drive a car -Paediatric nurse -Drink alcoholic beverages -Take any medication unless instructed by your physician -Make any legal decisions or sign important papers.  Meals: Start with liquid foods such as gelatin or soup. Progress to regular foods as tolerated. Avoid greasy, spicy, heavy foods. If nausea and/or vomiting occur, drink only clear liquids until the nausea and/or vomiting subsides. Call your physician if vomiting continues.  Special Instructions/Symptoms: Your throat may feel dry or sore from the anesthesia or the breathing tube placed in your throat during surgery. If this causes discomfort, gargle with warm salt water. The discomfort should disappear within 24 hours.  If you had a scopolamine patch placed behind your ear for the management of post- operative nausea and/or vomiting:  1. The medication in the patch is effective for 72 hours, after which it should be removed.  Wrap patch in a tissue and discard in the trash. Wash hands thoroughly with soap and water. 2. You may remove the patch earlier than 72 hours if you experience unpleasant side effects which may include dry mouth, dizziness or visual disturbances. 3. Avoid touching the patch. Wash your hands with soap and water after contact with the patch.

## 2021-02-19 NOTE — Op Note (Signed)
INCISIONAL HERNIA REPAIR WITH MESH  Procedure Note  Janice Guerra 02/19/2021   Pre-op Diagnosis: INCISIONAL HERNIA     Post-op Diagnosis: same  Procedure(s): INCISIONAL HERNIA REPAIR WITH MESH  Surgeon(s): Coralie Keens, MD  Anesthesia: General  Staff:  Circulator: Aura Fey, RN Relief Circulator: Jolene Schimke, RN Scrub Person: Key, Lannette Donath I  Estimated Blood Loss: Minimal               Findings: The patient was found to have a small fascial defect at the robotic port site in her left mid abdomen.  It was repaired with a 6.4 round ventral light ST Prolene patch from [facility]  Procedure: Patient was brought to the operating room identifies correct patient.  She is placed upon the operating table general anesthesia was induced.  Her abdomen was prepped and draped in usual sterile fashion.  I anesthetized the skin around the previous scar above the hernia with Marcaine.  I then excised the scar with #15 blade.  The complete excision with the cautery excising the underlying dermis as well.  I then dissected down to the hernia sac.  It was easily identified, containing preperitoneal fat.  I excised the sac in its entirety and gain entrance to the abdominal cavity.  Again, all contents have been reduced.  The fascial defect was approximately 2 and half centimeters in size.  A 6.4 around the ventral Prolene patch from [facility] was brought to the field.  It was placed through the fascial opening and then pulled up into the peritoneum with the stay ties.  I then sutured the mesh in place circumferentially with #1 Novafil sutures.  I then cut the stay ties and closed the fascia over the top of the mesh with a figure-of-eight #1 Novafil suture.  Wide coverage of the defect and then closure of the defect appeared to be achieved.  Hemostasis was achieved with the cautery.  I anesthetized the fascia and skin further with Marcaine.  I then closed the subcutaneous tissue with interrupted  3-0 Vicryl sutures and closed the skin with a running 4-0 Monocryl suture.  Dermabond was then applied.  The patient tolerated the procedure well.  All the counts were correct at the end of the procedure.  The patient was then extubated in the operating room and taken in stable addition to recovery room.          Coralie Keens   Date: 02/19/2021  Time: 9:31 AM

## 2021-02-19 NOTE — Anesthesia Postprocedure Evaluation (Signed)
Anesthesia Post Note  Patient: Janice Guerra  Procedure(s) Performed: INCISIONAL HERNIA REPAIR WITH MESH (N/A Abdomen)     Patient location during evaluation: PACU Anesthesia Type: General Level of consciousness: awake Pain management: pain level controlled Vital Signs Assessment: post-procedure vital signs reviewed and stable Respiratory status: spontaneous breathing Cardiovascular status: stable Postop Assessment: no apparent nausea or vomiting Anesthetic complications: no   No complications documented.  Last Vitals:  Vitals:   02/19/21 0731 02/19/21 0933  BP: (!) 145/73 124/81  Pulse: 79 74  Resp: 17 10  Temp: 37.1 C 36.4 C  SpO2: 98% 99%    Last Pain:  Vitals:   02/19/21 0933  TempSrc:   PainSc: 0-No pain                 Brenetta Penny

## 2021-02-19 NOTE — Transfer of Care (Signed)
Immediate Anesthesia Transfer of Care Note  Patient: Janice Guerra  Procedure(s) Performed: INCISIONAL HERNIA REPAIR WITH MESH (N/A Abdomen)  Patient Location: PACU  Anesthesia Type:General  Level of Consciousness: awake, alert  and patient cooperative  Airway & Oxygen Therapy: Patient Spontanous Breathing  Post-op Assessment: Report given to RN and Post -op Vital signs reviewed and stable  Post vital signs: Reviewed and stable  Last Vitals:  Vitals Value Taken Time  BP    Temp    Pulse 74 02/19/21 0935  Resp 11 02/19/21 0935  SpO2 100 % 02/19/21 0935  Vitals shown include unvalidated device data.  Last Pain:  Vitals:   02/19/21 0731  TempSrc: Oral  PainSc: 0-No pain      Patients Stated Pain Goal: 5 (09/40/76 8088)  Complications: No complications documented.

## 2021-02-19 NOTE — Anesthesia Preprocedure Evaluation (Signed)
Anesthesia Evaluation  Patient identified by MRN, date of birth, ID band Patient awake    Reviewed: Allergy & Precautions, NPO status , Patient's Chart, lab work & pertinent test results  Airway Mallampati: II  TM Distance: >3 FB     Dental   Pulmonary neg pulmonary ROS,    breath sounds clear to auscultation       Cardiovascular hypertension,  Rhythm:Regular Rate:Normal     Neuro/Psych negative neurological ROS     GI/Hepatic Neg liver ROS, History noted CG   Endo/Other  negative endocrine ROS  Renal/GU Renal disease     Musculoskeletal   Abdominal   Peds  Hematology   Anesthesia Other Findings   Reproductive/Obstetrics                             Anesthesia Physical Anesthesia Plan  ASA: III  Anesthesia Plan: General   Post-op Pain Management:    Induction: Intravenous  PONV Risk Score and Plan: 4 or greater and Ondansetron, Dexamethasone and Midazolam  Airway Management Planned: LMA  Additional Equipment:   Intra-op Plan:   Post-operative Plan: Extubation in OR  Informed Consent: I have reviewed the patients History and Physical, chart, labs and discussed the procedure including the risks, benefits and alternatives for the proposed anesthesia with the patient or authorized representative who has indicated his/her understanding and acceptance.     Dental advisory given  Plan Discussed with: CRNA  Anesthesia Plan Comments:         Anesthesia Quick Evaluation

## 2021-02-19 NOTE — Anesthesia Procedure Notes (Signed)
Procedure Name: LMA Insertion Date/Time: 02/19/2021 8:52 AM Performed by: Lollie Sails, CRNA Pre-anesthesia Checklist: Patient identified, Emergency Drugs available, Suction available, Patient being monitored and Timeout performed Patient Re-evaluated:Patient Re-evaluated prior to induction Oxygen Delivery Method: Circle system utilized Preoxygenation: Pre-oxygenation with 100% oxygen Induction Type: IV induction Ventilation: Mask ventilation without difficulty LMA: LMA inserted LMA Size: 4.0 Number of attempts: 2 Placement Confirmation: positive ETCO2 and breath sounds checked- equal and bilateral Tube secured with: Tape Dental Injury: Teeth and Oropharynx as per pre-operative assessment

## 2021-02-20 ENCOUNTER — Encounter (HOSPITAL_BASED_OUTPATIENT_CLINIC_OR_DEPARTMENT_OTHER): Payer: Self-pay | Admitting: Surgery

## 2021-05-14 ENCOUNTER — Ambulatory Visit (INDEPENDENT_AMBULATORY_CARE_PROVIDER_SITE_OTHER): Payer: BC Managed Care – PPO

## 2021-05-14 ENCOUNTER — Other Ambulatory Visit: Payer: Self-pay

## 2021-05-14 ENCOUNTER — Ambulatory Visit (INDEPENDENT_AMBULATORY_CARE_PROVIDER_SITE_OTHER): Payer: BC Managed Care – PPO | Admitting: Podiatry

## 2021-05-14 DIAGNOSIS — S9032XA Contusion of left foot, initial encounter: Secondary | ICD-10-CM | POA: Diagnosis not present

## 2021-05-14 DIAGNOSIS — L84 Corns and callosities: Secondary | ICD-10-CM | POA: Diagnosis not present

## 2021-05-14 DIAGNOSIS — E669 Obesity, unspecified: Secondary | ICD-10-CM | POA: Insufficient documentation

## 2021-05-14 DIAGNOSIS — M7752 Other enthesopathy of left foot: Secondary | ICD-10-CM

## 2021-05-14 DIAGNOSIS — L709 Acne, unspecified: Secondary | ICD-10-CM | POA: Insufficient documentation

## 2021-05-14 DIAGNOSIS — M25472 Effusion, left ankle: Secondary | ICD-10-CM

## 2021-05-14 DIAGNOSIS — N951 Menopausal and female climacteric states: Secondary | ICD-10-CM | POA: Insufficient documentation

## 2021-05-14 DIAGNOSIS — E215 Disorder of parathyroid gland, unspecified: Secondary | ICD-10-CM | POA: Insufficient documentation

## 2021-05-14 MED ORDER — METHYLPREDNISOLONE 4 MG PO TBPK: ORAL_TABLET | ORAL | 0 refills | Status: DC

## 2021-05-14 NOTE — Patient Instructions (Signed)

## 2021-05-19 NOTE — Progress Notes (Signed)
Subjective:   Patient ID: Janice Guerra, female   DOB: 65 y.o.   MRN: [id number]   HPI 65 year old female presents the office today for concerns of left ankle discomfort, swelling.  She states that she has pain in the left ankle and it radiates into the foot.  She points on the medial aspect of her foot on the arch area just inferior to the navicular tuberosity where she gets area discomfort as well.  She denies any recent injury or trauma or stepping any foreign objects.  She had no recent treatment.  She has no other concerns.   Review of Systems  All other systems reviewed and are negative. Past Medical History:  Diagnosis Date   Cancer of kidney ([place]) 2019   left kidney surgery done for tumor 10 percent of left kidney removed   History of kidney stones    HNP (herniated nucleus pulposus), lumbar    EPIDURAL 2013 - NO SURGERY - STILL HAS OCCAS BACK PAIN   Hypertension    Incisional hernia    Numbness of toes    herniated disc left lumbar on and off numbness   Shingles 15 yrs ago   Sty, internal 02/15/2017   right eye   Wears glasses     Past Surgical History:  Procedure Laterality Date   ABDOMINAL HYSTERECTOMY  35 yrs ago   partial has right ovary   CYSTOSCOPY WITH RETROGRADE PYELOGRAM, URETEROSCOPY AND STENT PLACEMENT Left 05/03/2013   Procedure: CYSTOSCOPY WITH LEFT RETROGRADE PYELOGRAM, LEFT URETEROSCOPY AND LEFT URETERAL STENT PLACEMENT;  Surgeon: Malka So, MD;  Location: [facility];  Service: Urology;  Laterality: Left;   CYSTOSCOPY WITH RETROGRADE PYELOGRAM, URETEROSCOPY AND STENT PLACEMENT Right 03/31/2017   Procedure: CYSTOSCOPY WITH RIGHT  RETROGRADE PYELOGRAM, URETEROSCOPY HOLMIUM LASER  AND STENT PLACEMENT;  Surgeon: Irine Seal, MD;  Location: [facility];  Service: Urology;  Laterality: Right;   CYSTOSCOPY/URETEROSCOPY/HOLMIUM LASER/STENT PLACEMENT     03/31/17   EXTRACORPOREAL SHOCK WAVE LITHOTRIPSY Right 02/24/2017   Procedure: RIGHT EXTRACORPOREAL SHOCK WAVE  LITHOTRIPSY (ESWL);  Surgeon: Alexis Frock, MD;  Location: [facility];  Service: Urology;  Laterality: Right;   HOLMIUM LASER APPLICATION Left 04/04/3328   Procedure: HOLMIUM LASER APPLICATION;  Surgeon: Malka So, MD;  Location: [facility];  Service: Urology;  Laterality: Left;   HOLMIUM LASER APPLICATION Right 03/19/8415   Procedure: HOLMIUM LASER APPLICATION;  Surgeon: Irine Seal, MD;  Location: [facility];  Service: Urology;  Laterality: Right;   INCISIONAL HERNIA REPAIR N/A 02/19/2021   Procedure: INCISIONAL HERNIA REPAIR WITH MESH;  Surgeon: Coralie Keens, MD;  [facility];  Service: General;  Laterality: N/A;   LITHOTRIPSY  2012   for kidney stones x 3   LITHOTRIPSY     x 15 times last done 2018   lower Rt parathyroid gland removed  20012   ROBOT ASSISTED LAPAROSCOPIC NEPHRECTOMY Left 10/09/2018   Procedure: XI ROBOTIC ASSISTED LAPAROSCOPIC PARTIAL NEPHRECTOMY;  Surgeon: Ceasar Mons, MD;  Location: [facility];  Service: Urology;  Laterality: Left;   Rt hand trigger finger  2008     Current Outpatient Medications:    methylPREDNISolone (MEDROL DOSEPAK) 4 MG TBPK tablet, Take as directed, Disp: 21 tablet, Rfl: 0   Cyanocobalamin (VITAMIN B 12 PO), Take by mouth. daily, Disp: , Rfl:    cycloSPORINE (RESTASIS) 0.05 % ophthalmic emulsion, 1 drop 2 (two) times daily., Disp: , Rfl:    ELDERBERRY PO, Take by mouth daily., Disp: ,  Rfl:    losartan (COZAAR) 100 MG tablet, Take 100 mg by mouth daily., Disp: , Rfl:    OVER THE COUNTER MEDICATION, Vitamin d 3 daily, Disp: , Rfl:    OVER THE COUNTER MEDICATION, Glucosamine/chondritin 2 tabs daily, Disp: , Rfl:    oxyCODONE (OXY IR/ROXICODONE) 5 MG immediate release tablet, Take 1 tablet (5 mg total) by mouth every 6 (six) hours as needed for moderate pain or severe pain., Disp: 20 tablet, Rfl: 0   Turmeric (QC TUMERIC COMPLEX PO), Take by mouth daily., Disp: , Rfl:    UNABLE TO FIND, Apple cider vinegar gel tab 2  daily, Disp: , Rfl:   Allergies  Allergen Reactions   Penicillins Hives and Rash    Has patient had a PCN reaction causing immediate rash, facial/tongue/throat swelling, SOB or lightheadedness with hypotension: No Has patient had a PCN reaction causing severe rash involving mucus membranes or skin necrosis: No Has patient had a PCN reaction that required hospitalization: No Has patient had a PCN reaction occurring within the last 10 years: No If all of the above answers are "NO", then may proceed with Cephalosporin use.            Objective:  Physical Exam  General: AAO x3, NAD  Dermatological: Small area of hyperkeratotic tissue on the plantar aspect left foot distal to the here and inferior to the navicular tuberosity.  There is no underlying ulceration drainage or signs of infection but there is no foreign body or puncture wound noted.  No open lesions.  Vascular: Dorsalis Pedis artery and Posterior Tibial artery pedal pulses are 2/4 bilateral with immedate capillary fill time. There is no pain with calf compression, swelling, warmth, erythema.   Neruologic: Grossly intact via light touch bilateral.   Musculoskeletal: There is tenderness in the area hyperkeratotic lesion as mentioned above.  There is also edema present to the ankle with tenderness palpation on the anterior ankle joint line and only injury medial aspect.  There is no pain or crepitation with ankle range of motion.  No erythema point tenderness.  Muscular strength 5/5 in all groups tested bilateral.  Mild decreased medial arch upon weightbearing.  No pain on the right.  Gait: Unassisted, Nonantalgic.       Assessment:   Right ankle capsulitis, skin lesion/callus     Plan:  -Treatment options discussed including all alternatives, risks, and complications -Etiology of symptoms were discussed -X-rays were obtained and reviewed with the patient.  There is no evidence of acute fracture or stress fracture. -I  sharply debrided the hyperkeratotic lesion without any complications or bleeding.  Recommend moisturizer daily. -Prescribed Medrol Dosepak -Discussed shoes and arch supports -Discussed traction exercises.  Provided her with handout on plan fasciitis although she does not have Planter fasciitis but exercises may be beneficial.  Trula Slade DPM

## 2021-06-16 ENCOUNTER — Ambulatory Visit: Payer: BC Managed Care – PPO | Admitting: Podiatry

## 2021-06-30 DIAGNOSIS — I10 Essential (primary) hypertension: Secondary | ICD-10-CM | POA: Diagnosis not present

## 2021-06-30 DIAGNOSIS — R059 Cough, unspecified: Secondary | ICD-10-CM | POA: Diagnosis not present

## 2021-07-22 ENCOUNTER — Other Ambulatory Visit: Payer: Self-pay | Admitting: Family Medicine

## 2021-07-22 DIAGNOSIS — Z1231 Encounter for screening mammogram for malignant neoplasm of breast: Secondary | ICD-10-CM

## 2021-08-12 ENCOUNTER — Other Ambulatory Visit: Payer: Self-pay

## 2021-08-12 ENCOUNTER — Ambulatory Visit
Admission: RE | Admit: 2021-08-12 | Discharge: 2021-08-12 | Disposition: A | Discharge status: Home / Self Care | Payer: BC Managed Care – PPO | Source: Ambulatory Visit | Attending: Family Medicine | Admitting: Family Medicine

## 2021-08-12 DIAGNOSIS — Z1231 Encounter for screening mammogram for malignant neoplasm of breast: Secondary | ICD-10-CM

## 2021-08-20 DIAGNOSIS — Z23 Encounter for immunization: Secondary | ICD-10-CM | POA: Diagnosis not present

## 2021-08-27 ENCOUNTER — Other Ambulatory Visit (HOSPITAL_COMMUNITY): Payer: Self-pay | Admitting: Urology

## 2021-08-27 ENCOUNTER — Other Ambulatory Visit: Payer: Self-pay

## 2021-08-27 ENCOUNTER — Ambulatory Visit (HOSPITAL_COMMUNITY)
Admission: RE | Admit: 2021-08-27 | Discharge: 2021-08-27 | Disposition: A | Discharge status: Home / Self Care | Payer: BC Managed Care – PPO | Source: Ambulatory Visit | Attending: Urology | Admitting: Urology

## 2021-08-27 DIAGNOSIS — J984 Other disorders of lung: Secondary | ICD-10-CM | POA: Diagnosis not present

## 2021-08-27 DIAGNOSIS — C642 Malignant neoplasm of left kidney, except renal pelvis: Secondary | ICD-10-CM

## 2021-09-04 DIAGNOSIS — N200 Calculus of kidney: Secondary | ICD-10-CM | POA: Diagnosis not present

## 2021-09-04 DIAGNOSIS — Z85528 Personal history of other malignant neoplasm of kidney: Secondary | ICD-10-CM | POA: Diagnosis not present

## 2021-09-04 DIAGNOSIS — C642 Malignant neoplasm of left kidney, except renal pelvis: Secondary | ICD-10-CM | POA: Diagnosis not present

## 2021-09-04 DIAGNOSIS — N281 Cyst of kidney, acquired: Secondary | ICD-10-CM | POA: Diagnosis not present

## 2021-09-04 DIAGNOSIS — K439 Ventral hernia without obstruction or gangrene: Secondary | ICD-10-CM | POA: Diagnosis not present

## 2021-10-05 DIAGNOSIS — C642 Malignant neoplasm of left kidney, except renal pelvis: Secondary | ICD-10-CM | POA: Diagnosis not present

## 2021-10-05 DIAGNOSIS — N200 Calculus of kidney: Secondary | ICD-10-CM | POA: Diagnosis not present

## 2022-01-14 DIAGNOSIS — R053 Chronic cough: Secondary | ICD-10-CM | POA: Diagnosis not present

## 2022-01-14 DIAGNOSIS — I10 Essential (primary) hypertension: Secondary | ICD-10-CM | POA: Diagnosis not present

## 2022-01-14 DIAGNOSIS — R0982 Postnasal drip: Secondary | ICD-10-CM | POA: Diagnosis not present

## 2022-02-04 ENCOUNTER — Encounter: Payer: Self-pay | Admitting: Emergency Medicine

## 2022-02-04 ENCOUNTER — Ambulatory Visit: Payer: BC Managed Care – PPO

## 2022-02-04 ENCOUNTER — Ambulatory Visit (INDEPENDENT_AMBULATORY_CARE_PROVIDER_SITE_OTHER): Payer: BC Managed Care – PPO | Admitting: Emergency Medicine

## 2022-02-04 VITALS — BP 116/72 | HR 75 | Temp 98.4°F | Ht 66.5 in | Wt 237.0 lb

## 2022-02-04 DIAGNOSIS — R053 Chronic cough: Secondary | ICD-10-CM

## 2022-02-04 MED ORDER — CETIRIZINE HCL 10 MG PO TABS: 10.0000 mg | ORAL_TABLET | Freq: Every day | ORAL | 0 refills | Status: DC

## 2022-02-04 MED ORDER — OMEPRAZOLE 40 MG PO CPDR: 40.0000 mg | DELAYED_RELEASE_CAPSULE | Freq: Every day | ORAL | 1 refills | Status: DC

## 2022-02-04 MED ORDER — VALSARTAN 80 MG PO TABS: 80.0000 mg | ORAL_TABLET | Freq: Every day | ORAL | 1 refills | Status: DC

## 2022-02-04 NOTE — Assessment & Plan Note (Signed)
Suspect upper airway irritation given the globus sensation, duration.  Need to rule out obstructive lung disease with pulmonary function testing.  We will repeat her chest x-ray to ensure no evidence of any contributor to cough.  Depending on work-up and response to therapy we may decide to perform a CT chest, could possibly even consider bronchoscopy going forward. ?She has responded briefly/partially to prednisone in the past.  Has been tried on empiric GERD therapy, also on rhinitis therapy which may have been a bit more effective.  We will plan to treat all potential upper airway irritants, including changing her losartan to an alternative ARB.  Follow-up for improvement and to review her testing. ? ? ?We will perform a chest x-ray today ?We will arrange for pulmonary function testing at your next office visit ?Please continue your cetirizine 10 mg once daily. ?Please continue your fluticasone nasal spray, 2 sprays each nostril once daily.  Try not to take this medication right before bedtime or else it will drain to your throat and cause irritation. ?Stop your losartan ?We will start valsartan 80 mg once daily. ?Start omeprazole 20 mg twice a day until next visit.  Take this medication 1 hour around food.  We may not need to continue it going forward. ?Try to avoid throat clearing if at all possible.  It may help to use a sugar-free candy or non mentholated cough drop.  When you have a desire to clear your throat, just swallow. ?We can give you a prescription for Hycodan cough syrup to suppress cough especially at night if you would like to have it.  You can use 5 cc up to every 6 hours if needed for cough suppression. ?Follow Dr. Lamonte Sakai next available with PFT on the same day and we will review your testing and your status. ?

## 2022-02-04 NOTE — Progress Notes (Signed)
Automated everything clear I made that she want ? ?Subjective:  ? ? Patient ID: Janice Guerra, female    DOB: May 22, 1956, 66 y.o.   MRN: [id number] ? ?HPI ?66 year old never smoker with a history of renal cell carcinoma (resected 2019 Dr Gilford Rile), hypertension, degenerative disc disease, allergic rhinitis.  She is here today to discuss chronic cough. ?She describes cough that started about 8 months ago without a clear precipitant. Usually non-productive.  Was treated empirically for GERD and rhinitis, not at the same time. Seems to be worse at night, can be associated with some wheeze and dyspnea especially at night. She just started nasal steroid and zyrtec. She may have benefited some from albuterol.  She was changed from lisinopril to losartan about a year ago - may have helped cough some.  ? ?Chest x-ray 08/27/2021 reviewed by me showed some left basilar atelectatic change, no infiltrates or effusions. ? ? ?Review of Systems ?As per HPI ? ?Past Medical History:  ?Diagnosis Date  ? Cancer of kidney ([place]) 2019  ? left kidney surgery done for tumor 10 percent of left kidney removed  ? History of kidney stones   ? HNP (herniated nucleus pulposus), lumbar   ? EPIDURAL 2013 - NO SURGERY - STILL HAS OCCAS BACK PAIN  ? Hypertension   ? Incisional hernia   ? Numbness of toes   ? herniated disc left lumbar on and off numbness  ? Shingles 15 yrs ago  ? Sty, internal 02/15/2017  ? right eye  ? Wears glasses   ?  ? ?Family History  ?Problem Relation Age of Onset  ? Colon cancer Paternal Uncle 74  ? Colon cancer Maternal Grandfather 60  ? Stomach cancer Neg Hx   ?  ? ?Social History  ? ?Socioeconomic History  ? Marital status: Married  ?  Spouse name: Not on file  ? Number of children: Not on file  ? Years of education: Not on file  ? Highest education level: Not on file  ?Occupational History  ? Not on file  ?Tobacco Use  ? Smoking status: Never  ? Smokeless tobacco: Never  ?Vaping Use  ? Vaping Use: Never used   ?Substance and Sexual Activity  ? Alcohol use: No  ? Drug use: No  ? Sexual activity: Yes  ?  Birth control/protection: Surgical  ?Other Topics Concern  ? Not on file  ?Social History Narrative  ? Not on file  ? ?Social Determinants of Health  ? ?Financial Resource Strain: Not on file  ?Food Insecurity: Not on file  ?Transportation Needs: Not on file  ?Physical Activity: Not on file  ?Stress: Not on file  ?Social Connections: Not on file  ?Intimate Partner Violence: Not on file  ?  ? ?Allergies  ?Allergen Reactions  ? Penicillins Hives and Rash  ?  Has patient had a PCN reaction causing immediate rash, facial/tongue/throat swelling, SOB or lightheadedness with hypotension: No ?Has patient had a PCN reaction causing severe rash involving mucus membranes or skin necrosis: No ?Has patient had a PCN reaction that required hospitalization: No ?Has patient had a PCN reaction occurring within the last 10 years: No ?If all of the above answers are "NO", then may proceed with Cephalosporin use. ?  ?  ? ?Outpatient Medications Prior to Visit  ?Medication Sig Dispense Refill  ? albuterol (VENTOLIN HFA) 108 (90 Base) MCG/ACT inhaler Inhale 2 puffs into the lungs.    ? Cyanocobalamin (VITAMIN B 12 PO) Take by mouth.  daily    ? cycloSPORINE (RESTASIS) 0.05 % ophthalmic emulsion 1 drop 2 (two) times daily.    ? ELDERBERRY PO Take by mouth daily.    ? fluticasone (FLONASE) 50 MCG/ACT nasal spray Place 2 sprays into both nostrils daily.    ? losartan (COZAAR) 100 MG tablet Take 100 mg by mouth daily.    ? OVER THE COUNTER MEDICATION Vitamin d 3 daily    ? OVER THE COUNTER MEDICATION Glucosamine/chondritin 2 tabs daily    ? Turmeric (QC TUMERIC COMPLEX PO) Take by mouth daily.    ? UNABLE TO FIND Apple cider vinegar gel tab 2 daily    ? cetirizine (ZYRTEC) 10 MG tablet Take 10 mg by mouth daily.    ? benzonatate (TESSALON) 200 MG capsule Take 200 mg by mouth 3 (three) times daily as needed. (Patient not taking: Reported on 02/04/2022)     ? methylPREDNISolone (MEDROL DOSEPAK) 4 MG TBPK tablet Take as directed (Patient not taking: Reported on 02/04/2022) 21 tablet 0  ? oxyCODONE (OXY IR/ROXICODONE) 5 MG immediate release tablet Take 1 tablet (5 mg total) by mouth every 6 (six) hours as needed for moderate pain or severe pain. (Patient not taking: Reported on 02/04/2022) 20 tablet 0  ? ?No facility-administered medications prior to visit.  ? ? ? ? ?   ?Objective:  ? Physical Exam ?Vitals:  ? 02/04/22 1601  ?BP: 116/72  ?Pulse: 75  ?Temp: 98.4 ?F (36.9 ?C)  ?TempSrc: Oral  ?SpO2: 100%  ?Weight: 237 lb (107.5 kg)  ?Height: 5' 6.5" (1.689 m)  ? ?Gen: Pleasant, well-nourished, in no distress,  normal affect ? ?ENT: No lesions,  mouth clear,  oropharynx clear, no postnasal drip ? ?Neck: No JVD, no stridor ? ?Lungs: No use of accessory muscles, no crackles or wheezing on normal respiration, no wheeze on forced expiration ? ?Cardiovascular: RRR, heart sounds normal, no murmur or gallops, no peripheral edema ? ?Musculoskeletal: No deformities, no cyanosis or clubbing ? ?Neuro: alert, awake, non focal ? ?Skin: Warm, no lesions or rash ? ? ?   ?Assessment & Plan:  ? ?Chronic cough ?Suspect upper airway irritation given the globus sensation, duration.  Need to rule out obstructive lung disease with pulmonary function testing.  We will repeat her chest x-ray to ensure no evidence of any contributor to cough.  Depending on work-up and response to therapy we may decide to perform a CT chest, could possibly even consider bronchoscopy going forward. ?She has responded briefly/partially to prednisone in the past.  Has been tried on empiric GERD therapy, also on rhinitis therapy which may have been a bit more effective.  We will plan to treat all potential upper airway irritants, including changing her losartan to an alternative ARB.  Follow-up for improvement and to review her testing. ? ? ?We will perform a chest x-ray today ?We will arrange for pulmonary function  testing at your next office visit ?Please continue your cetirizine 10 mg once daily. ?Please continue your fluticasone nasal spray, 2 sprays each nostril once daily.  Try not to take this medication right before bedtime or else it will drain to your throat and cause irritation. ?Stop your losartan ?We will start valsartan 80 mg once daily. ?Start omeprazole 20 mg twice a day until next visit.  Take this medication 1 hour around food.  We may not need to continue it going forward. ?Try to avoid throat clearing if at all possible.  It may help to use a  sugar-free candy or non mentholated cough drop.  When you have a desire to clear your throat, just swallow. ?We can give you a prescription for Hycodan cough syrup to suppress cough especially at night if you would like to have it.  You can use 5 cc up to every 6 hours if needed for cough suppression. ?Follow Dr. Lamonte Sakai next available with PFT on the same day and we will review your testing and your status. ? ? ?Baltazar Apo, MD, PhD ?02/04/2022, 5:02 PM ?[facility] ?[phone] or if no answer before 7:00PM call [phone] ?For any issues after 7:00PM please call eLink [phone] ? ?

## 2022-02-04 NOTE — Patient Instructions (Signed)
We will perform a chest x-ray today ?We will arrange for pulmonary function testing at your next office visit ?Please continue your cetirizine 10 mg once daily. ?Please continue your fluticasone nasal spray, 2 sprays each nostril once daily.  Try not to take this medication right before bedtime or else it will drain to your throat and cause irritation. ?Stop your losartan ?We will start valsartan 80 mg once daily. ?Start omeprazole 20 mg twice a day until next visit.  Take this medication 1 hour around food.  We may not need to continue it going forward. ?Try to avoid throat clearing if at all possible.  It may help to use a sugar-free candy or non mentholated cough drop.  When you have a desire to clear your throat, just swallow. ?We can give you a prescription for Hycodan cough syrup to suppress cough especially at night if you would like to have it.  You can use 5 cc up to every 6 hours if needed for cough suppression. ?Follow Dr. Lamonte Sakai next available with PFT on the same day and we will review your testing and your status. ?

## 2022-02-12 ENCOUNTER — Encounter: Payer: Self-pay | Admitting: Gastroenterology

## 2022-03-16 ENCOUNTER — Ambulatory Visit (AMBULATORY_SURGERY_CENTER): Payer: Self-pay

## 2022-03-16 ENCOUNTER — Other Ambulatory Visit: Payer: Self-pay

## 2022-03-16 ENCOUNTER — Encounter: Payer: Self-pay | Admitting: Gastroenterology

## 2022-03-16 VITALS — Ht 66.5 in | Wt 223.0 lb

## 2022-03-16 DIAGNOSIS — Z1211 Encounter for screening for malignant neoplasm of colon: Secondary | ICD-10-CM

## 2022-03-16 MED ORDER — NA SULFATE-K SULFATE-MG SULF 17.5-3.13-1.6 GM/177ML PO SOLN: 1.0000 | Freq: Once | ORAL | 0 refills | Status: AC

## 2022-03-16 NOTE — Progress Notes (Signed)
Denies allergies to eggs or soy products. Denies complication of anesthesia or sedation. Denies use of weight loss medication. Denies use of O2.   Emmi instructions given for colonoscopy.  

## 2022-03-19 ENCOUNTER — Ambulatory Visit: Payer: BC Managed Care – PPO | Admitting: Emergency Medicine

## 2022-03-31 ENCOUNTER — Other Ambulatory Visit: Payer: Self-pay | Admitting: Emergency Medicine

## 2022-04-06 ENCOUNTER — Encounter: Payer: Self-pay | Admitting: Gastroenterology

## 2022-04-09 ENCOUNTER — Encounter: Payer: Self-pay | Admitting: Gastroenterology

## 2022-04-09 ENCOUNTER — Ambulatory Visit (AMBULATORY_SURGERY_CENTER): Payer: BC Managed Care – PPO | Admitting: Gastroenterology

## 2022-04-09 VITALS — BP 127/66 | HR 62 | Temp 97.8°F | Resp 12 | Ht 66.5 in | Wt 223.0 lb

## 2022-04-09 DIAGNOSIS — Z1211 Encounter for screening for malignant neoplasm of colon: Secondary | ICD-10-CM

## 2022-04-09 MED ORDER — SODIUM CHLORIDE 0.9 % IV SOLN: 500.0000 mL | Freq: Once | INTRAVENOUS | Status: DC

## 2022-04-09 NOTE — Progress Notes (Signed)
History and Physical:  This patient presents for endoscopic testing for: Encounter Diagnosis  Name Primary?   Special screening for malignant neoplasms, colon Yes    Last colonoscopy April 2013 - no polyps Patient denies chronic abdominal pain, rectal bleeding, constipation or diarrhea.   Patient is otherwise without complaints or active issues today.   Past Medical History: Past Medical History:  Diagnosis Date   Allergy    Cancer of kidney ([place]) 2019   left kidney surgery done for tumor 10 percent of left kidney removed   History of kidney stones    HNP (herniated nucleus pulposus), lumbar    EPIDURAL 2013 - NO SURGERY - STILL HAS OCCAS BACK PAIN   Hypertension    Incisional hernia    Numbness of toes    herniated disc left lumbar on and off numbness   Shingles 15 yrs ago   Sty, internal 02/15/2017   right eye   Wears glasses      Past Surgical History: Past Surgical History:  Procedure Laterality Date   ABDOMINAL HYSTERECTOMY  35 yrs ago   partial has right ovary   CYSTOSCOPY WITH RETROGRADE PYELOGRAM, URETEROSCOPY AND STENT PLACEMENT Left 05/03/2013   Procedure: CYSTOSCOPY WITH LEFT RETROGRADE PYELOGRAM, LEFT URETEROSCOPY AND LEFT URETERAL STENT PLACEMENT;  Surgeon: Malka So, MD;  Location: [facility];  Service: Urology;  Laterality: Left;   CYSTOSCOPY WITH RETROGRADE PYELOGRAM, URETEROSCOPY AND STENT PLACEMENT Right 03/31/2017   Procedure: CYSTOSCOPY WITH RIGHT  RETROGRADE PYELOGRAM, URETEROSCOPY HOLMIUM LASER  AND STENT PLACEMENT;  Surgeon: Irine Seal, MD;  Location: [facility];  Service: Urology;  Laterality: Right;   CYSTOSCOPY/URETEROSCOPY/HOLMIUM LASER/STENT PLACEMENT     03/31/17   EXTRACORPOREAL SHOCK WAVE LITHOTRIPSY Right 02/24/2017   Procedure: RIGHT EXTRACORPOREAL SHOCK WAVE LITHOTRIPSY (ESWL);  Surgeon: Alexis Frock, MD;  Location: [facility];  Service: Urology;  Laterality: Right;   HOLMIUM LASER APPLICATION Left 06/01/1913   Procedure: HOLMIUM LASER APPLICATION;   Surgeon: Malka So, MD;  Location: [facility];  Service: Urology;  Laterality: Left;   HOLMIUM LASER APPLICATION Right 7/82/9562   Procedure: HOLMIUM LASER APPLICATION;  Surgeon: Irine Seal, MD;  Location: [facility];  Service: Urology;  Laterality: Right;   INCISIONAL HERNIA REPAIR N/A 02/19/2021   Procedure: INCISIONAL HERNIA REPAIR WITH MESH;  Surgeon: Coralie Keens, MD;  [facility];  Service: General;  Laterality: N/A;   LITHOTRIPSY  2012   for kidney stones x 3   LITHOTRIPSY     x 15 times last done 2018   lower Rt parathyroid gland removed  20012   ROBOT ASSISTED LAPAROSCOPIC NEPHRECTOMY Left 10/09/2018   Procedure: XI ROBOTIC ASSISTED LAPAROSCOPIC PARTIAL NEPHRECTOMY;  Surgeon: Ceasar Mons, MD;  Location: [facility];  Service: Urology;  Laterality: Left;   Rt hand trigger finger  2008    Allergies: Allergies  Allergen Reactions   Penicillins Hives and Rash    Has patient had a PCN reaction causing immediate rash, facial/tongue/throat swelling, SOB or lightheadedness with hypotension: No Has patient had a PCN reaction causing severe rash involving mucus membranes or skin necrosis: No Has patient had a PCN reaction that required hospitalization: No Has patient had a PCN reaction occurring within the last 10 years: No If all of the above answers are "NO", then may proceed with Cephalosporin use.     Outpatient Meds: Current Outpatient Medications  Medication Sig Dispense Refill   cetirizine (ZYRTEC) 10 MG tablet TAKE 1 TABLET(10 MG) BY MOUTH  DAILY 30 tablet 0   Cyanocobalamin (VITAMIN B 12 PO) Take by mouth. daily     cycloSPORINE (RESTASIS) 0.05 % ophthalmic emulsion 1 drop 2 (two) times daily.     OVER THE COUNTER MEDICATION Vitamin d 3 daily     OVER THE COUNTER MEDICATION Glucosamine/chondritin 2 tabs daily     Turmeric (QC TUMERIC COMPLEX PO) Take by mouth daily.     valsartan (DIOVAN) 80 MG tablet Take 1 tablet (80 mg total) by mouth  daily. 90 tablet 1   albuterol (VENTOLIN HFA) 108 (90 Base) MCG/ACT inhaler Inhale 2 puffs into the lungs.     ELDERBERRY PO Take by mouth daily.     fluticasone (FLONASE) 50 MCG/ACT nasal spray Place 2 sprays into both nostrils daily.     Current Facility-Administered Medications  Medication Dose Route Frequency Provider Last Rate Last Admin   0.9 %  sodium chloride infusion  500 mL Intravenous Once Danis, Kirke Corin, MD          ___________________________________________________________________ Objective   Exam:  BP (!) 142/68 (BP Location: Right Arm, Patient Position: Sitting, Cuff Size: Normal)   Pulse 75   Temp 97.8 F (36.6 C) (Temporal)   Ht 5' 6.5" (1.689 m)   Wt 223 lb (101.2 kg)   SpO2 97%   BMI 35.45 kg/m   CV: RRR without murmur, S1/S2 Resp: clear to auscultation bilaterally, normal RR and effort noted GI: soft, no tenderness, with active bowel sounds.   Assessment: Encounter Diagnosis  Name Primary?   Special screening for malignant neoplasms, colon Yes     Plan: Colonoscopy  The benefits and risks of the planned procedure were described in detail with the patient or (when appropriate) their health care proxy.  Risks were outlined as including, but not limited to, bleeding, infection, perforation, adverse medication reaction leading to cardiac or pulmonary decompensation, pancreatitis (if ERCP).  The limitation of incomplete mucosal visualization was also discussed.  No guarantees or warranties were given.    The patient is appropriate for an endoscopic procedure in the ambulatory setting.   - Wilfrid Lund, MD

## 2022-04-09 NOTE — Progress Notes (Signed)
A and O x3. Report to RN. Tolerated MAC anesthesia well. 

## 2022-04-09 NOTE — Patient Instructions (Signed)

## 2022-04-09 NOTE — Progress Notes (Signed)
Vitals DTP Pt's states no medical or surgical changes since previsit or office visit.

## 2022-04-09 NOTE — Op Note (Signed)
[facility] Patient Name: Janice Guerra Procedure Date: 04/09/2022 10:09 AM MRN: [id number] Endoscopist: Mallie Mussel L. Loletha Carrow , MD Age: 66 Referring MD:  Date of Birth: September 15, 1956 Gender: Female Account #: [id number] Procedure:                Colonoscopy Indications:              Screening for colorectal malignant neoplasm Medicines:                Monitored Anesthesia Care Procedure:                Pre-Anesthesia Assessment:                           - Prior to the procedure, a History and Physical                            was performed, and patient medications and                            allergies were reviewed. The patient's tolerance of                            previous anesthesia was also reviewed. The risks                            and benefits of the procedure and the sedation                            options and risks were discussed with the patient.                            All questions were answered, and informed consent                            was obtained. Prior Anticoagulants: The patient has                            taken no previous anticoagulant or antiplatelet                            agents. ASA Grade Assessment: II - A patient with                            mild systemic disease. After reviewing the risks                            and benefits, the patient was deemed in                            satisfactory condition to undergo the procedure.                           After obtaining informed consent, the colonoscope  was passed under direct vision. Throughout the                            procedure, the patient's blood pressure, pulse, and                            oxygen saturations were monitored continuously. The                            CF [id number] #[id number] was introduced through the anus                            and advanced to the the terminal ileum, with                            identification of  the appendiceal orifice and IC                            valve. The colonoscopy was performed without                            difficulty. The patient tolerated the procedure                            well. The quality of the bowel preparation was                            excellent. The terminal ileum, ileocecal valve,                            appendiceal orifice, and rectum were photographed. Scope In: 10:21:56 AM Scope Out: 10:33:43 AM Scope Withdrawal Time: 0 hours 8 minutes 29 seconds  Total Procedure Duration: 0 hours 11 minutes 47 seconds  Findings:                 The perianal and digital rectal examinations were                            normal.                           The terminal ileum appeared normal.                           Repeat examination of right colon under NBI                            performed.                           Internal hemorrhoids were found. The hemorrhoids                            were small.  The exam was otherwise without abnormality on                            direct and retroflexion views. Complications:            No immediate complications. Estimated Blood Loss:     Estimated blood loss: none. Impression:               - The examined portion of the ileum was normal.                           - Internal hemorrhoids.                           - The examination was otherwise normal on direct                            and retroflexion views.                           - No specimens collected. Recommendation:           - Patient has a contact number available for                            emergencies. The signs and symptoms of potential                            delayed complications were discussed with the                            patient. Return to normal activities tomorrow.                            Written discharge instructions were provided to the                            patient.                            - Resume previous diet.                           - Continue present medications.                           - Repeat colonoscopy in 10 years for screening                            purposes. Bentlee Drier L. Loletha Carrow, MD 04/09/2022 10:36:53 AM This report has been signed electronically.

## 2022-04-12 ENCOUNTER — Telehealth: Payer: Self-pay | Admitting: *Deleted

## 2022-04-12 NOTE — Telephone Encounter (Signed)
  Follow up Call-     04/09/2022    9:52 AM  Call back number  Post procedure Call Back phone  # [phone]  Permission to leave phone message Yes     Patient questions:  Do you have a fever, pain , or abdominal swelling? No. Pain Score  0 *  Have you tolerated food without any problems? Yes.    Have you been able to return to your normal activities? Yes.    Do you have any questions about your discharge instructions: Diet   No. Medications  No. Follow up visit  No.  Do you have questions or concerns about your Care? No.  Actions: * If pain score is 4 or above: No action needed, pain <4.

## 2022-04-12 NOTE — Telephone Encounter (Signed)
No answer on first attempt follow up call. Left message.  ?

## 2022-04-29 ENCOUNTER — Encounter: Payer: Self-pay | Admitting: Emergency Medicine

## 2022-04-29 ENCOUNTER — Ambulatory Visit (INDEPENDENT_AMBULATORY_CARE_PROVIDER_SITE_OTHER): Payer: BC Managed Care – PPO | Admitting: Emergency Medicine

## 2022-04-29 DIAGNOSIS — J4520 Mild intermittent asthma, uncomplicated: Secondary | ICD-10-CM | POA: Diagnosis not present

## 2022-04-29 DIAGNOSIS — J45909 Unspecified asthma, uncomplicated: Secondary | ICD-10-CM | POA: Insufficient documentation

## 2022-04-29 DIAGNOSIS — R053 Chronic cough: Secondary | ICD-10-CM | POA: Diagnosis not present

## 2022-04-29 LAB — PULMONARY FUNCTION TEST
DL/VA % pred: 121 %
DL/VA: 4.99 ml/min/mmHg/L
DLCO cor % pred: 96 %
DLCO cor: 20.39 ml/min/mmHg
DLCO unc % pred: 96 %
DLCO unc: 20.39 ml/min/mmHg
FEF 25-75 Post: 3.63 L/sec
FEF 25-75 Pre: 1.9 L/sec
FEF2575-%Change-Post: 90 %
FEF2575-%Pred-Post: 161 %
FEF2575-%Pred-Pre: 84 %
FEV1-%Change-Post: 17 %
FEV1-%Pred-Post: 72 %
FEV1-%Pred-Pre: 61 %
FEV1-Post: 1.88 L
FEV1-Pre: 1.61 L
FEV1FVC-%Change-Post: 0 %
FEV1FVC-%Pred-Pre: 110 %
FEV6-%Change-Post: 18 %
FEV6-%Pred-Post: 68 %
FEV6-%Pred-Pre: 57 %
FEV6-Post: 2.24 L
FEV6-Pre: 1.89 L
FEV6FVC-%Pred-Post: 104 %
FEV6FVC-%Pred-Pre: 104 %
FVC-%Change-Post: 18 %
FVC-%Pred-Post: 65 %
FVC-%Pred-Pre: 55 %
FVC-Post: 2.24 L
FVC-Pre: 1.89 L
Post FEV1/FVC ratio: 84 %
Post FEV6/FVC ratio: 100 %
Pre FEV1/FVC ratio: 85 %
Pre FEV6/FVC Ratio: 100 %
RV % pred: 128 %
RV: 2.8 L
TLC % pred: 102 %
TLC: 5.46 L

## 2022-04-29 MED ORDER — ALBUTEROL SULFATE HFA 108 (90 BASE) MCG/ACT IN AERS: 2.0000 | INHALATION_SPRAY | RESPIRATORY_TRACT | 3 refills | Status: DC | PRN

## 2022-04-29 NOTE — Patient Instructions (Signed)
We reviewed your pulmonary function testing today. Keep albuterol available to use 2 puffs if you needed for shortness of breath, wheezing, spells of coughing.  You could also try pretreating either exercise or bedtime to see if this helps prevent symptoms. Continue your Zyrtec and fluticasone nasal spray as you have been taking them. Stop your omeprazole.  Keep track of whether your cough becomes more active although this medication.  If so we may need to go back on it Follow with Dr Lamonte Sakai in 6 months or sooner if you have any problems

## 2022-04-29 NOTE — Progress Notes (Signed)
Automated everything clear I made that she want  Subjective:    Patient ID: Janice Guerra, female    DOB: 29-Jun-1956, 66 y.o.   MRN: [id number]  HPI 66 year old never smoker with a history of renal cell carcinoma (resected 2019 Dr Gilford Rile), hypertension, degenerative disc disease, allergic rhinitis.  She is here today to discuss chronic cough. She describes cough that started about 8 months ago without a clear precipitant. Usually non-productive.  Was treated empirically for GERD and rhinitis, not at the same time. Seems to be worse at night, can be associated with some wheeze and dyspnea especially at night. She just started nasal steroid and zyrtec. She may have benefited some from albuterol.  She was changed from lisinopril to losartan about a year ago - may have helped cough some.   Chest x-ray 08/27/2021 reviewed by me showed some left basilar atelectatic change, no infiltrates or effusions.   ROV 04/29/22 --follow-up visit 66 year old woman woman for chronic cough.  PMH: Renal cell carcinoma resected, hypertension, DDD, rhinitis.  She had a globus sensation, exam consistent with upper airway irritation.  I asked her to losartan to valsartan, started omeprazole twice daily.  Continued cetirizine and fluticasone nasal spray.  Chest x-ray was not done.  She underwent pulmonary function testing today. She is still having some cough, may be a bit better, still happening, often at night. Does still have some wheeze, SOB at night as well.   Pulmonary function testing performed today and reviewed by me show mixed restriction and obstruction on spirometry with a variable inspiratory loop consistent with upper airway irritation.  Her obstruction is confirmed by a significant bronchodilator response of 18%.  Lung volumes are normal.  Diffusion capacity normal.   Review of Systems As per HPI  Past Medical History:  Diagnosis Date   Allergy    Cancer of kidney ([place]) 2019   left kidney surgery done for  tumor 10 percent of left kidney removed   History of kidney stones    HNP (herniated nucleus pulposus), lumbar    EPIDURAL 2013 - NO SURGERY - STILL HAS OCCAS BACK PAIN   Hypertension    Incisional hernia    Numbness of toes    herniated disc left lumbar on and off numbness   Shingles 15 yrs ago   Sty, internal 02/15/2017   right eye   Wears glasses      Family History  Problem Relation Age of Onset   Colon cancer Paternal Uncle 27   Colon cancer Maternal Grandfather 109   Stomach cancer Neg Hx    Esophageal cancer Neg Hx    Rectal cancer Neg Hx      Social History   Socioeconomic History   Marital status: Married    Spouse name: Not on file   Number of children: Not on file   Years of education: Not on file   Highest education level: Not on file  Occupational History   Not on file  Tobacco Use   Smoking status: Never   Smokeless tobacco: Never  Vaping Use   Vaping Use: Never used  Substance and Sexual Activity   Alcohol use: No   Drug use: No   Sexual activity: Yes    Birth control/protection: Surgical  Other Topics Concern   Not on file  Social History Narrative   Not on file   Social Determinants of Health   Financial Resource Strain: Not on file  Food Insecurity: Not on file  Transportation Needs:  Not on file  Physical Activity: Not on file  Stress: Not on file  Social Connections: Not on file  Intimate Partner Violence: Not on file     Allergies  Allergen Reactions   Penicillins Hives and Rash    Has patient had a PCN reaction causing immediate rash, facial/tongue/throat swelling, SOB or lightheadedness with hypotension: No Has patient had a PCN reaction causing severe rash involving mucus membranes or skin necrosis: No Has patient had a PCN reaction that required hospitalization: No Has patient had a PCN reaction occurring within the last 10 years: No If all of the above answers are "NO", then may proceed with Cephalosporin use.       Outpatient Medications Prior to Visit  Medication Sig Dispense Refill   albuterol (VENTOLIN HFA) 108 (90 Base) MCG/ACT inhaler Inhale 2 puffs into the lungs.     cetirizine (ZYRTEC) 10 MG tablet TAKE 1 TABLET(10 MG) BY MOUTH DAILY 30 tablet 0   Cyanocobalamin (VITAMIN B 12 PO) Take by mouth. daily     cycloSPORINE (RESTASIS) 0.05 % ophthalmic emulsion 1 drop 2 (two) times daily.     ELDERBERRY PO Take by mouth daily.     fluticasone (FLONASE) 50 MCG/ACT nasal spray Place 2 sprays into both nostrils daily.     OVER THE COUNTER MEDICATION Vitamin d 3 daily     OVER THE COUNTER MEDICATION Glucosamine/chondritin 2 tabs daily     Turmeric (QC TUMERIC COMPLEX PO) Take by mouth daily.     valsartan (DIOVAN) 80 MG tablet Take 1 tablet (80 mg total) by mouth daily. 90 tablet 1   No facility-administered medications prior to visit.         Objective:   Physical Exam Vitals:   04/29/22 1610  BP: 112/82  Pulse: 75  SpO2: 95%  Weight: 238 lb (108 kg)  Height: '5\' 6"'$  (1.676 m)   Gen: Pleasant, well-nourished, in no distress,  normal affect  ENT: No lesions,  mouth clear,  oropharynx clear, no postnasal drip  Neck: No JVD, no stridor  Lungs: No use of accessory muscles, no crackles or wheezing on normal respiration, no wheeze on forced expiration  Cardiovascular: RRR, heart sounds normal, no murmur or gallops, no peripheral edema  Musculoskeletal: No deformities, no cyanosis or clubbing  Neuro: alert, awake, non focal  Skin: Warm, no lesions or rash      Assessment & Plan:   Asthma Pulmonary function testing with a positive bronchodilator response consistent with mixed restriction and obstruction.  She has dyspnea, wheeze, cough, often at night.  We will try using albuterol.  If she gets benefit then we could consider long-acting medication, ICS.  Chronic cough We will continue her allergy regimen.  I will stop her omeprazole.  Have added albuterol for suspected asthma  component as above.   Baltazar Apo, MD, PhD 04/29/2022, 4:39 PM [facility] [phone] or if no answer before 7:00PM call [phone] For any issues after 7:00PM please call eLink [phone]

## 2022-04-29 NOTE — Assessment & Plan Note (Signed)
Pulmonary function testing with a positive bronchodilator response consistent with mixed restriction and obstruction.  She has dyspnea, wheeze, cough, often at night.  We will try using albuterol.  If she gets benefit then we could consider long-acting medication, ICS.

## 2022-04-29 NOTE — Addendum Note (Signed)
Addended by: Gavin Potters R on: 04/29/2022 04:40 PM   Modules accepted: Orders

## 2022-04-29 NOTE — Assessment & Plan Note (Signed)
We will continue her allergy regimen.  I will stop her omeprazole.  Have added albuterol for suspected asthma component as above.

## 2022-04-29 NOTE — Progress Notes (Signed)
PFT done today. 

## 2022-07-02 ENCOUNTER — Other Ambulatory Visit (HOSPITAL_COMMUNITY): Payer: Self-pay | Admitting: Urology

## 2022-07-02 ENCOUNTER — Ambulatory Visit (HOSPITAL_COMMUNITY)
Admission: RE | Admit: 2022-07-02 | Discharge: 2022-07-02 | Disposition: A | Discharge status: Home / Self Care | Payer: BC Managed Care – PPO | Source: Ambulatory Visit | Attending: Urology | Admitting: Urology

## 2022-07-02 DIAGNOSIS — C649 Malignant neoplasm of unspecified kidney, except renal pelvis: Secondary | ICD-10-CM | POA: Diagnosis not present

## 2022-07-02 DIAGNOSIS — C642 Malignant neoplasm of left kidney, except renal pelvis: Secondary | ICD-10-CM

## 2022-07-06 ENCOUNTER — Other Ambulatory Visit: Payer: Self-pay | Admitting: Family Medicine

## 2022-07-06 DIAGNOSIS — Z1231 Encounter for screening mammogram for malignant neoplasm of breast: Secondary | ICD-10-CM

## 2022-07-09 DIAGNOSIS — Z85528 Personal history of other malignant neoplasm of kidney: Secondary | ICD-10-CM | POA: Diagnosis not present

## 2022-07-09 DIAGNOSIS — N200 Calculus of kidney: Secondary | ICD-10-CM | POA: Diagnosis not present

## 2022-07-09 DIAGNOSIS — C642 Malignant neoplasm of left kidney, except renal pelvis: Secondary | ICD-10-CM | POA: Diagnosis not present

## 2022-07-09 DIAGNOSIS — N289 Disorder of kidney and ureter, unspecified: Secondary | ICD-10-CM | POA: Diagnosis not present

## 2022-07-09 DIAGNOSIS — Z9889 Other specified postprocedural states: Secondary | ICD-10-CM | POA: Diagnosis not present

## 2022-07-12 DIAGNOSIS — N200 Calculus of kidney: Secondary | ICD-10-CM | POA: Diagnosis not present

## 2022-07-12 DIAGNOSIS — C642 Malignant neoplasm of left kidney, except renal pelvis: Secondary | ICD-10-CM | POA: Diagnosis not present

## 2022-07-15 ENCOUNTER — Other Ambulatory Visit: Payer: Self-pay | Admitting: Emergency Medicine

## 2022-08-16 DIAGNOSIS — Z23 Encounter for immunization: Secondary | ICD-10-CM | POA: Diagnosis not present

## 2022-08-19 DIAGNOSIS — R739 Hyperglycemia, unspecified: Secondary | ICD-10-CM | POA: Diagnosis not present

## 2022-08-19 DIAGNOSIS — Z13220 Encounter for screening for lipoid disorders: Secondary | ICD-10-CM | POA: Diagnosis not present

## 2022-08-19 DIAGNOSIS — Z0000 Encounter for general adult medical examination without abnormal findings: Secondary | ICD-10-CM | POA: Diagnosis not present

## 2022-08-19 DIAGNOSIS — I10 Essential (primary) hypertension: Secondary | ICD-10-CM | POA: Diagnosis not present

## 2022-08-19 DIAGNOSIS — Z1159 Encounter for screening for other viral diseases: Secondary | ICD-10-CM | POA: Diagnosis not present

## 2022-08-19 DIAGNOSIS — Z1331 Encounter for screening for depression: Secondary | ICD-10-CM | POA: Diagnosis not present

## 2022-08-24 ENCOUNTER — Other Ambulatory Visit: Payer: Self-pay | Admitting: Emergency Medicine

## 2022-08-27 ENCOUNTER — Ambulatory Visit
Admission: RE | Admit: 2022-08-27 | Discharge: 2022-08-27 | Disposition: A | Discharge status: Home / Self Care | Payer: BC Managed Care – PPO | Source: Ambulatory Visit | Attending: Family Medicine | Admitting: Family Medicine

## 2022-08-27 DIAGNOSIS — Z1231 Encounter for screening mammogram for malignant neoplasm of breast: Secondary | ICD-10-CM | POA: Diagnosis not present

## 2022-10-05 DIAGNOSIS — H02413 Mechanical ptosis of bilateral eyelids: Secondary | ICD-10-CM | POA: Diagnosis not present

## 2022-10-05 DIAGNOSIS — H02831 Dermatochalasis of right upper eyelid: Secondary | ICD-10-CM | POA: Diagnosis not present

## 2022-10-05 DIAGNOSIS — H0279 Other degenerative disorders of eyelid and periocular area: Secondary | ICD-10-CM | POA: Diagnosis not present

## 2022-10-05 DIAGNOSIS — H02834 Dermatochalasis of left upper eyelid: Secondary | ICD-10-CM | POA: Diagnosis not present

## 2022-10-05 DIAGNOSIS — H02423 Myogenic ptosis of bilateral eyelids: Secondary | ICD-10-CM | POA: Diagnosis not present

## 2022-10-13 DIAGNOSIS — R7303 Prediabetes: Secondary | ICD-10-CM | POA: Diagnosis not present

## 2022-10-13 DIAGNOSIS — E7800 Pure hypercholesterolemia, unspecified: Secondary | ICD-10-CM | POA: Diagnosis not present

## 2022-11-17 ENCOUNTER — Ambulatory Visit: Payer: BC Managed Care – PPO | Admitting: Emergency Medicine

## 2022-11-23 ENCOUNTER — Encounter: Payer: Self-pay | Admitting: Nurse Practitioner

## 2022-11-23 ENCOUNTER — Ambulatory Visit (INDEPENDENT_AMBULATORY_CARE_PROVIDER_SITE_OTHER): Payer: BC Managed Care – PPO | Admitting: Nurse Practitioner

## 2022-11-23 VITALS — BP 110/74 | HR 70 | Ht 67.0 in | Wt 236.6 lb

## 2022-11-23 DIAGNOSIS — J4520 Mild intermittent asthma, uncomplicated: Secondary | ICD-10-CM

## 2022-11-23 NOTE — Progress Notes (Unsigned)
$'@Patient'i$  ID: Janice Guerra, female    DOB: 01-28-1956, 67 y.o.   MRN: [id number]  No chief complaint on file.   Referring provider: Maury Dus, MD  HPI:   TEST/EVENTS:   Allergies  Allergen Reactions   Penicillins Hives and Rash    Has patient had a PCN reaction causing immediate rash, facial/tongue/throat swelling, SOB or lightheadedness with hypotension: No Has patient had a PCN reaction causing severe rash involving mucus membranes or skin necrosis: No Has patient had a PCN reaction that required hospitalization: No Has patient had a PCN reaction occurring within the last 10 years: No If all of the above answers are "NO", then may proceed with Cephalosporin use.     Immunization History  Administered Date(s) Administered   Influenza Split 09/13/2014, 08/05/2015, 07/28/2018   Influenza,inj,Quad PF,6+ Mos 08/05/2015   Influenza-Unspecified 08/01/2021   Moderna Sars-Covid-2 Vaccination 01/04/2020, 02/06/2020   PFIZER(Purple Top)SARS-COV-2 Vaccination 01/04/2020, 02/06/2020, 10/01/2021   Tdap 07/26/2006   Zoster, Live 10/10/2014, 07/10/2021, 10/29/2021    Past Medical History:  Diagnosis Date   Allergy    Cancer of kidney ([place]) 2019   left kidney surgery done for tumor 10 percent of left kidney removed   History of kidney stones    HNP (herniated nucleus pulposus), lumbar    EPIDURAL 2013 - NO SURGERY - STILL HAS OCCAS BACK PAIN   Hypertension    Incisional hernia    Numbness of toes    herniated disc left lumbar on and off numbness   Shingles 15 yrs ago   Sty, internal 02/15/2017   right eye   Wears glasses     Tobacco History: Social History   Tobacco Use  Smoking Status Never  Smokeless Tobacco Never   Counseling given: Not Answered   Outpatient Medications Prior to Visit  Medication Sig Dispense Refill   albuterol (VENTOLIN HFA) 108 (90 Base) MCG/ACT inhaler Inhale 2 puffs into the lungs every 4 (four) hours as needed for wheezing or  shortness of breath. 18 g 3   cetirizine (ZYRTEC) 10 MG tablet TAKE 1 TABLET(10 MG) BY MOUTH DAILY 90 tablet 1   Cyanocobalamin (VITAMIN B 12 PO) Take by mouth. daily     cycloSPORINE (RESTASIS) 0.05 % ophthalmic emulsion 1 drop 2 (two) times daily.     ELDERBERRY PO Take by mouth daily.     fluticasone (FLONASE) 50 MCG/ACT nasal spray Place 2 sprays into both nostrils daily.     OVER THE COUNTER MEDICATION Vitamin d 3 daily     OVER THE COUNTER MEDICATION Glucosamine/chondritin 2 tabs daily     Turmeric (QC TUMERIC COMPLEX PO) Take by mouth daily.     valsartan (DIOVAN) 80 MG tablet TAKE 1 TABLET(80 MG) BY MOUTH DAILY 90 tablet 1   No facility-administered medications prior to visit.     Review of Systems:   Constitutional: No weight loss or gain, night sweats, fevers, chills, fatigue, or lassitude. HEENT: No headaches, difficulty swallowing, tooth/dental problems, or sore throat. No sneezing, itching, ear ache, nasal congestion, or post nasal drip CV:  No chest pain, orthopnea, PND, swelling in lower extremities, anasarca, dizziness, palpitations, syncope Resp: No shortness of breath with exertion or at rest. No excess mucus or change in color of mucus. No productive or non-productive. No hemoptysis. No wheezing.  No chest wall deformity GI:  No heartburn, indigestion, abdominal pain, nausea, vomiting, diarrhea, change in bowel habits, loss of appetite, bloody stools.  GU: No dysuria, change in  color of urine, urgency or frequency.  No flank pain, no hematuria  Skin: No rash, lesions, ulcerations MSK:  No joint pain or swelling.  No decreased range of motion.  No back pain. Neuro: No dizziness or lightheadedness.  Psych: No depression or anxiety. Mood stable.     Physical Exam:  There were no vitals taken for this visit.  GEN: Pleasant, interactive, well-nourished/chronically-ill appearing/acutely-ill appearing/poorly-nourished/morbidly obese; in no acute distress.****** HEENT:   Normocephalic and atraumatic. EACs patent bilaterally. TM pearly gray with present light reflex bilaterally. PERRLA. Sclera white. Nasal turbinates pink, moist and patent bilaterally. No rhinorrhea present. Oropharynx pink and moist, without exudate or edema. No lesions, ulcerations, or postnasal drip.  NECK:  Supple w/ fair ROM. No JVD present. Normal carotid impulses w/o bruits. Thyroid symmetrical with no goiter or nodules palpated. No lymphadenopathy.   CV: RRR, no m/r/g, no peripheral edema. Pulses intact, +2 bilaterally. No cyanosis, pallor or clubbing. PULMONARY:  Unlabored, regular breathing. Clear bilaterally A&P w/o wheezes/rales/rhonchi. No accessory muscle use.  GI: BS present and normoactive. Soft, non-tender to palpation. No organomegaly or masses detected. No CVA tenderness. MSK: No erythema, warmth or tenderness. Cap refil <2 sec all extrem. No deformities or joint swelling noted.  Neuro: A/Ox3. No focal deficits noted.   Skin: Warm, no lesions or rashe Psych: Normal affect and behavior. Judgement and thought content appropriate.     Lab Results:  CBC    Component Value Date/Time   WBC 5.6 10/06/2018 1833   RBC 4.52 10/06/2018 1833   HGB 14.3 02/19/2021 0749   HCT 42.0 02/19/2021 0749   PLT 236 10/06/2018 1833   MCV 86.9 10/06/2018 1833   MCH 27.0 10/06/2018 1833   MCHC 31.0 10/06/2018 1833   RDW 14.2 10/06/2018 1833   LYMPHSABS 1.9 10/05/2010 1500   MONOABS 0.3 10/05/2010 1500   EOSABS 0.2 10/05/2010 1500   BASOSABS 0.0 10/05/2010 1500    BMET    Component Value Date/Time   NA 139 02/19/2021 0749   K 3.7 02/19/2021 0749   CL 102 02/19/2021 0749   CO2 26 10/10/2018 0537   GLUCOSE 114 (H) 02/19/2021 0749   BUN 14 02/19/2021 0749   CREATININE 0.60 02/19/2021 0749   CALCIUM 8.4 (L) 10/10/2018 0537   GFRNONAA >60 10/10/2018 0537   GFRAA >60 10/10/2018 0537    BNP No results found for: "BNP"   Imaging:  No results found.       Latest Ref Rng &  Units 04/29/2022    2:54 PM  PFT Results  FVC-Pre L 1.89   FVC-Predicted Pre % 55   FVC-Post L 2.24   FVC-Predicted Post % 65   Pre FEV1/FVC % % 85   Post FEV1/FCV % % 84   FEV1-Pre L 1.61   FEV1-Predicted Pre % 61   FEV1-Post L 1.88   DLCO uncorrected ml/min/mmHg 20.39   DLCO UNC% % 96   DLCO corrected ml/min/mmHg 20.39   DLCO COR %Predicted % 96   DLVA Predicted % 121   TLC L 5.46   TLC % Predicted % 102   RV % Predicted % 128     No results found for: "NITRICOXIDE"      Assessment & Plan:   No problem-specific Assessment & Plan notes found for this encounter.   I spent *** minutes of dedicated to the care of this patient on the date of this encounter to include pre-visit review of records, face-to-face time with the patient discussing  conditions above, post visit ordering of testing, clinical documentation with the electronic health record, making appropriate referrals as documented, and communicating necessary findings to members of the patients care team.  Clayton Bibles, NP 11/23/2022  Pt aware and understands NP's role.

## 2022-11-23 NOTE — Patient Instructions (Addendum)
Continue Albuterol inhaler 2 puffs every 6 hours as needed for shortness of breath or wheezing. Notify if symptoms persist despite rescue inhaler/neb use.  Continue zyrtec 10 mg daily   Asthma action plan: START inhaler up to six times a day for worsening shortness of breath, wheezing and cough. If you symptoms do not improve in 24-48 hours, contact us for steroid course. If your symptoms rapidly worsen, you have trouble talking or extreme difficulty breathing, or you're not getting relief from your nebulizer/rescue, go to the emergency department.  Follow up in one year with Janice Guerra. If symptoms worsen, please contact office for sooner follow up or seek emergency care.

## 2022-11-25 ENCOUNTER — Encounter: Payer: Self-pay | Admitting: Nurse Practitioner

## 2022-11-25 NOTE — Assessment & Plan Note (Addendum)
Mild intermittent asthma. Compensated on current regimen with PRN SABA. No exacerbations requiring steroids or abx.No hospitalizations. In the future if she has increased symptoms, would consider addition of ICS or ICS/SABA. Asthma action plan in place. Encouraged to remain active.   Patient Instructions  Continue Albuterol inhaler 2 puffs every 6 hours as needed for shortness of breath or wheezing. Notify if symptoms persist despite rescue inhaler/neb use.  Continue zyrtec 10 mg daily   Asthma action plan: START inhaler up to six times a day for worsening shortness of breath, wheezing and cough. If you symptoms do not improve in 24-48 hours, contact us for steroid course. If your symptoms rapidly worsen, you have trouble talking or extreme difficulty breathing, or you're not getting relief from your nebulizer/rescue, go to the emergency department.  Follow up in one year with Dr. Lamonte Sakai. If symptoms worsen, please contact office for sooner follow up or seek emergency care.

## 2022-12-30 DIAGNOSIS — E782 Mixed hyperlipidemia: Secondary | ICD-10-CM | POA: Diagnosis not present

## 2022-12-30 DIAGNOSIS — E1169 Type 2 diabetes mellitus with other specified complication: Secondary | ICD-10-CM | POA: Diagnosis not present

## 2022-12-30 DIAGNOSIS — I10 Essential (primary) hypertension: Secondary | ICD-10-CM | POA: Diagnosis not present

## 2022-12-30 DIAGNOSIS — Z23 Encounter for immunization: Secondary | ICD-10-CM | POA: Diagnosis not present

## 2023-01-05 DIAGNOSIS — H53483 Generalized contraction of visual field, bilateral: Secondary | ICD-10-CM | POA: Diagnosis not present

## 2023-01-27 ENCOUNTER — Other Ambulatory Visit: Payer: Self-pay | Admitting: Emergency Medicine

## 2023-02-24 DIAGNOSIS — Z6834 Body mass index (BMI) 34.0-34.9, adult: Secondary | ICD-10-CM | POA: Diagnosis not present

## 2023-02-24 DIAGNOSIS — Z01419 Encounter for gynecological examination (general) (routine) without abnormal findings: Secondary | ICD-10-CM | POA: Diagnosis not present

## 2023-04-20 DIAGNOSIS — E7800 Pure hypercholesterolemia, unspecified: Secondary | ICD-10-CM | POA: Diagnosis not present

## 2023-04-20 DIAGNOSIS — I10 Essential (primary) hypertension: Secondary | ICD-10-CM | POA: Diagnosis not present

## 2023-04-20 DIAGNOSIS — E1169 Type 2 diabetes mellitus with other specified complication: Secondary | ICD-10-CM | POA: Diagnosis not present

## 2023-06-08 ENCOUNTER — Other Ambulatory Visit: Payer: Self-pay | Admitting: Emergency Medicine

## 2023-07-05 ENCOUNTER — Other Ambulatory Visit: Payer: Self-pay | Admitting: Family Medicine

## 2023-07-05 DIAGNOSIS — Z1231 Encounter for screening mammogram for malignant neoplasm of breast: Secondary | ICD-10-CM

## 2023-07-14 ENCOUNTER — Ambulatory Visit (HOSPITAL_COMMUNITY)
Admission: RE | Admit: 2023-07-14 | Discharge: 2023-07-14 | Disposition: A | Discharge status: Home / Self Care | Payer: BC Managed Care – PPO | Source: Ambulatory Visit | Attending: Urology | Admitting: Urology

## 2023-07-14 ENCOUNTER — Other Ambulatory Visit (HOSPITAL_COMMUNITY): Payer: Self-pay | Admitting: Urology

## 2023-07-14 DIAGNOSIS — D3502 Benign neoplasm of left adrenal gland: Secondary | ICD-10-CM | POA: Diagnosis not present

## 2023-07-14 DIAGNOSIS — Z905 Acquired absence of kidney: Secondary | ICD-10-CM | POA: Diagnosis not present

## 2023-07-14 DIAGNOSIS — C642 Malignant neoplasm of left kidney, except renal pelvis: Secondary | ICD-10-CM

## 2023-08-03 DIAGNOSIS — Z23 Encounter for immunization: Secondary | ICD-10-CM | POA: Diagnosis not present

## 2023-08-05 DIAGNOSIS — C642 Malignant neoplasm of left kidney, except renal pelvis: Secondary | ICD-10-CM | POA: Diagnosis not present

## 2023-08-05 DIAGNOSIS — N200 Calculus of kidney: Secondary | ICD-10-CM | POA: Diagnosis not present

## 2023-08-19 DIAGNOSIS — Z0000 Encounter for general adult medical examination without abnormal findings: Secondary | ICD-10-CM | POA: Diagnosis not present

## 2023-08-19 DIAGNOSIS — E1169 Type 2 diabetes mellitus with other specified complication: Secondary | ICD-10-CM | POA: Diagnosis not present

## 2023-08-19 DIAGNOSIS — I10 Essential (primary) hypertension: Secondary | ICD-10-CM | POA: Diagnosis not present

## 2023-08-19 DIAGNOSIS — J4520 Mild intermittent asthma, uncomplicated: Secondary | ICD-10-CM | POA: Diagnosis not present

## 2023-08-19 DIAGNOSIS — R202 Paresthesia of skin: Secondary | ICD-10-CM | POA: Diagnosis not present

## 2023-08-19 DIAGNOSIS — E782 Mixed hyperlipidemia: Secondary | ICD-10-CM | POA: Diagnosis not present

## 2023-08-23 ENCOUNTER — Other Ambulatory Visit: Payer: Self-pay | Admitting: Family Medicine

## 2023-08-23 DIAGNOSIS — E2839 Other primary ovarian failure: Secondary | ICD-10-CM

## 2023-08-29 ENCOUNTER — Ambulatory Visit
Admission: RE | Admit: 2023-08-29 | Discharge: 2023-08-29 | Disposition: A | Discharge status: Home / Self Care | Payer: BC Managed Care – PPO | Source: Ambulatory Visit | Attending: Family Medicine | Admitting: Family Medicine

## 2023-08-29 DIAGNOSIS — Z1231 Encounter for screening mammogram for malignant neoplasm of breast: Secondary | ICD-10-CM

## 2023-10-19 ENCOUNTER — Other Ambulatory Visit: Payer: Self-pay | Admitting: Emergency Medicine

## 2023-10-28 DIAGNOSIS — M4722 Other spondylosis with radiculopathy, cervical region: Secondary | ICD-10-CM | POA: Diagnosis not present

## 2023-10-28 DIAGNOSIS — M542 Cervicalgia: Secondary | ICD-10-CM | POA: Diagnosis not present

## 2023-10-28 DIAGNOSIS — M5442 Lumbago with sciatica, left side: Secondary | ICD-10-CM | POA: Diagnosis not present

## 2023-10-28 DIAGNOSIS — G8929 Other chronic pain: Secondary | ICD-10-CM | POA: Diagnosis not present

## 2023-11-07 ENCOUNTER — Other Ambulatory Visit: Payer: Self-pay | Admitting: Emergency Medicine

## 2023-11-10 NOTE — Telephone Encounter (Signed)
Pt needs an appointment for further refills  

## 2023-11-23 ENCOUNTER — Other Ambulatory Visit: Payer: Self-pay | Admitting: Emergency Medicine

## 2023-11-30 DIAGNOSIS — M5021 Other cervical disc displacement,  high cervical region: Secondary | ICD-10-CM | POA: Diagnosis not present

## 2023-11-30 DIAGNOSIS — M4807 Spinal stenosis, lumbosacral region: Secondary | ICD-10-CM | POA: Diagnosis not present

## 2023-11-30 DIAGNOSIS — M4722 Other spondylosis with radiculopathy, cervical region: Secondary | ICD-10-CM | POA: Diagnosis not present

## 2023-11-30 DIAGNOSIS — M79605 Pain in left leg: Secondary | ICD-10-CM | POA: Diagnosis not present

## 2023-11-30 DIAGNOSIS — M5116 Intervertebral disc disorders with radiculopathy, lumbar region: Secondary | ICD-10-CM | POA: Diagnosis not present

## 2023-11-30 DIAGNOSIS — M50221 Other cervical disc displacement at C4-C5 level: Secondary | ICD-10-CM | POA: Diagnosis not present

## 2023-11-30 DIAGNOSIS — M48061 Spinal stenosis, lumbar region without neurogenic claudication: Secondary | ICD-10-CM | POA: Diagnosis not present

## 2023-11-30 DIAGNOSIS — M4802 Spinal stenosis, cervical region: Secondary | ICD-10-CM | POA: Diagnosis not present

## 2023-11-30 DIAGNOSIS — M47812 Spondylosis without myelopathy or radiculopathy, cervical region: Secondary | ICD-10-CM | POA: Diagnosis not present

## 2023-11-30 DIAGNOSIS — M5442 Lumbago with sciatica, left side: Secondary | ICD-10-CM | POA: Diagnosis not present

## 2024-01-05 DIAGNOSIS — M4712 Other spondylosis with myelopathy, cervical region: Secondary | ICD-10-CM | POA: Diagnosis not present

## 2024-01-05 DIAGNOSIS — M5116 Intervertebral disc disorders with radiculopathy, lumbar region: Secondary | ICD-10-CM | POA: Diagnosis not present

## 2024-02-18 ENCOUNTER — Other Ambulatory Visit: Payer: Self-pay | Admitting: Emergency Medicine

## 2024-02-21 DIAGNOSIS — Z6832 Body mass index (BMI) 32.0-32.9, adult: Secondary | ICD-10-CM | POA: Diagnosis not present

## 2024-02-21 DIAGNOSIS — E782 Mixed hyperlipidemia: Secondary | ICD-10-CM | POA: Diagnosis not present

## 2024-02-21 DIAGNOSIS — E1169 Type 2 diabetes mellitus with other specified complication: Secondary | ICD-10-CM | POA: Diagnosis not present

## 2024-02-21 DIAGNOSIS — I10 Essential (primary) hypertension: Secondary | ICD-10-CM | POA: Diagnosis not present

## 2024-03-16 DIAGNOSIS — E876 Hypokalemia: Secondary | ICD-10-CM | POA: Diagnosis not present

## 2024-03-30 ENCOUNTER — Ambulatory Visit
Admission: RE | Admit: 2024-03-30 | Discharge: 2024-03-30 | Disposition: A | Discharge status: Home / Self Care | Payer: BC Managed Care – PPO | Source: Ambulatory Visit | Attending: Family Medicine | Admitting: Family Medicine

## 2024-03-30 DIAGNOSIS — M8588 Other specified disorders of bone density and structure, other site: Secondary | ICD-10-CM | POA: Diagnosis not present

## 2024-03-30 DIAGNOSIS — E2839 Other primary ovarian failure: Secondary | ICD-10-CM

## 2024-03-30 DIAGNOSIS — N958 Other specified menopausal and perimenopausal disorders: Secondary | ICD-10-CM | POA: Diagnosis not present

## 2024-05-14 DIAGNOSIS — Z01419 Encounter for gynecological examination (general) (routine) without abnormal findings: Secondary | ICD-10-CM | POA: Diagnosis not present

## 2024-07-19 ENCOUNTER — Other Ambulatory Visit: Payer: Self-pay | Admitting: Family Medicine

## 2024-07-19 DIAGNOSIS — Z1231 Encounter for screening mammogram for malignant neoplasm of breast: Secondary | ICD-10-CM

## 2024-08-07 ENCOUNTER — Other Ambulatory Visit: Payer: Self-pay | Admitting: Urology

## 2024-08-10 ENCOUNTER — Encounter (HOSPITAL_COMMUNITY): Payer: Self-pay | Admitting: Urology

## 2024-08-10 NOTE — Progress Notes (Signed)
 Attempted to obtain medical history for pre op call via telephone, unable to reach at this time. HIPAA compliant voicemail message left requesting return call to pre surgical testing department.

## 2024-08-10 NOTE — Progress Notes (Addendum)
 LITHO PREOP PHONE CALL   ALLERGIES REVIEWED: YES  MEDICATION REVIEW DONE: YES MEDICATIONS THAT PT SHOULD HOLD (LIST): ASA 72hr Ibuprofen  48hr  CAN PT WALK UP STAIRS WITHOUT SHORTNESS OF BREATH: YES HOME O2: NO CPAP: NO  IF YES, INFORMED PT TO BRING CPAP WITH TUBING AND MASK:YES/NO   INFORMED DRIVER NEEDED FOR PROCEDURE: YES   PT WAS GIVEN BLUE FOLDER AT UROLOGY APPT: YES PT INFORMED TO BRING BLUE FOLDER WITH ALL CONTENTS: YES  REVIEWED ARRIVAL TIME AND LOCATION: YES  OTHER PERTINENT INFORMATION:

## 2024-08-13 ENCOUNTER — Ambulatory Visit (HOSPITAL_COMMUNITY)
Admission: RE | Admit: 2024-08-13 | Discharge: 2024-08-13 | Disposition: A | Discharge status: Home / Self Care | Attending: Urology | Admitting: Urology

## 2024-08-13 ENCOUNTER — Ambulatory Visit (HOSPITAL_COMMUNITY)

## 2024-08-13 ENCOUNTER — Encounter (HOSPITAL_COMMUNITY): Payer: Self-pay | Admitting: Urology

## 2024-08-13 ENCOUNTER — Other Ambulatory Visit: Payer: Self-pay

## 2024-08-13 ENCOUNTER — Encounter (HOSPITAL_COMMUNITY)
Admission: RE | Disposition: A | Discharge status: Home / Self Care | Payer: Self-pay | Source: Home / Self Care | Attending: Urology

## 2024-08-13 DIAGNOSIS — Z6829 Body mass index (BMI) 29.0-29.9, adult: Secondary | ICD-10-CM | POA: Insufficient documentation

## 2024-08-13 DIAGNOSIS — Z7901 Long term (current) use of anticoagulants: Secondary | ICD-10-CM | POA: Diagnosis not present

## 2024-08-13 DIAGNOSIS — N200 Calculus of kidney: Secondary | ICD-10-CM | POA: Diagnosis present

## 2024-08-13 DIAGNOSIS — E119 Type 2 diabetes mellitus without complications: Secondary | ICD-10-CM | POA: Insufficient documentation

## 2024-08-13 DIAGNOSIS — Z7985 Long-term (current) use of injectable non-insulin antidiabetic drugs: Secondary | ICD-10-CM | POA: Diagnosis not present

## 2024-08-13 DIAGNOSIS — I10 Essential (primary) hypertension: Secondary | ICD-10-CM | POA: Insufficient documentation

## 2024-08-13 DIAGNOSIS — E669 Obesity, unspecified: Secondary | ICD-10-CM | POA: Insufficient documentation

## 2024-08-13 HISTORY — PX: EXTRACORPOREAL SHOCK WAVE LITHOTRIPSY: SHX1557

## 2024-08-13 SURGERY — LITHOTRIPSY, ESWL
Anesthesia: LOCAL | Laterality: Right

## 2024-08-13 MED ORDER — CIPROFLOXACIN HCL 500 MG PO TABS
500.0000 mg | ORAL_TABLET | ORAL | Status: AC
  Administered 2024-08-13: 500 mg via ORAL
  Filled 2024-08-13: qty 1

## 2024-08-13 MED ORDER — TAMSULOSIN HCL 0.4 MG PO CAPS: 0.4000 mg | ORAL_CAPSULE | Freq: Every day | ORAL | 0 refills | Status: DC

## 2024-08-13 MED ORDER — ONDANSETRON HCL 4 MG PO TABS: 4.0000 mg | ORAL_TABLET | Freq: Every day | ORAL | 1 refills | Status: DC | PRN

## 2024-08-13 MED ORDER — TAMSULOSIN HCL 0.4 MG PO CAPS: 0.4000 mg | ORAL_CAPSULE | Freq: Every day | ORAL | 0 refills | Status: AC

## 2024-08-13 MED ORDER — SODIUM CHLORIDE 0.9 % IV SOLN: INTRAVENOUS | Status: DC

## 2024-08-13 MED ORDER — ONDANSETRON HCL 4 MG PO TABS: 4.0000 mg | ORAL_TABLET | Freq: Every day | ORAL | 1 refills | Status: AC | PRN

## 2024-08-13 MED ORDER — TRAMADOL HCL 50 MG PO TABS: 50.0000 mg | ORAL_TABLET | Freq: Four times a day (QID) | ORAL | 0 refills | Status: DC | PRN

## 2024-08-13 MED ORDER — DIPHENHYDRAMINE HCL 25 MG PO CAPS
25.0000 mg | ORAL_CAPSULE | ORAL | Status: AC
  Administered 2024-08-13: 25 mg via ORAL
  Filled 2024-08-13: qty 1

## 2024-08-13 MED ORDER — DIAZEPAM 5 MG PO TABS
10.0000 mg | ORAL_TABLET | ORAL | Status: AC
  Administered 2024-08-13: 10 mg via ORAL
  Filled 2024-08-13: qty 2

## 2024-08-13 MED ORDER — TRAMADOL HCL 50 MG PO TABS: 50.0000 mg | ORAL_TABLET | Freq: Four times a day (QID) | ORAL | 0 refills | Status: AC | PRN

## 2024-08-13 NOTE — Progress Notes (Signed)
 Reddish pink blotchy area to the right flank side from lithotripsy

## 2024-08-13 NOTE — Op Note (Signed)
 ESWL Operative Note  Treating Physician: Lonni Han, MD  Pre-op diagnosis: Right renal stones  Post-op diagnosis: Same   Procedure: RIGHT ESWL  See Norita Dustman OP note scanned into chart. Also because of the size, density, location and other factors that cannot be anticipated I feel this will likely be a staged procedure. This fact supersedes any indication in the scanned [facility] operative note to the contrary

## 2024-08-13 NOTE — H&P (Signed)
See scanned [facility] documents for H&P.   

## 2024-08-14 ENCOUNTER — Encounter (HOSPITAL_COMMUNITY): Payer: Self-pay | Admitting: Urology

## 2024-08-30 ENCOUNTER — Ambulatory Visit
Admission: RE | Admit: 2024-08-30 | Discharge: 2024-08-30 | Disposition: A | Discharge status: Home / Self Care | Source: Ambulatory Visit | Attending: Family Medicine | Admitting: Family Medicine

## 2024-08-30 DIAGNOSIS — Z1231 Encounter for screening mammogram for malignant neoplasm of breast: Secondary | ICD-10-CM

## 2024-11-20 ENCOUNTER — Ambulatory Visit: Admitting: Podiatry

## 2024-11-21 ENCOUNTER — Ambulatory Visit

## 2024-11-21 ENCOUNTER — Encounter: Payer: Self-pay | Admitting: Podiatry

## 2024-11-21 ENCOUNTER — Ambulatory Visit (INDEPENDENT_AMBULATORY_CARE_PROVIDER_SITE_OTHER): Admitting: Podiatry

## 2024-11-21 VITALS — Ht 67.0 in | Wt 185.6 lb

## 2024-11-21 DIAGNOSIS — M2012 Hallux valgus (acquired), left foot: Secondary | ICD-10-CM

## 2024-11-21 DIAGNOSIS — M21612 Bunion of left foot: Secondary | ICD-10-CM | POA: Diagnosis not present

## 2024-11-21 DIAGNOSIS — M21611 Bunion of right foot: Secondary | ICD-10-CM | POA: Diagnosis not present

## 2024-11-21 DIAGNOSIS — M2011 Hallux valgus (acquired), right foot: Secondary | ICD-10-CM

## 2024-11-21 NOTE — Progress Notes (Signed)
 "  Chief Complaint  Patient presents with   Bunions    Pt is here due to bilateral bunions that are starting to cause pain, wants to discuss surgery options.    HPI: 69 y.o. femalepresenting for evaluation of mildly symptomatic bunion deformity to the bilateral feet.  Past Medical History:  Diagnosis Date   Allergy    Cancer of kidney ([facility]) 2019   left kidney surgery done for tumor 10 percent of left kidney removed   History of kidney stones    HNP (herniated nucleus pulposus), lumbar    EPIDURAL 2013 - NO SURGERY - STILL HAS OCCAS BACK PAIN   Hypertension    Incisional hernia    Numbness of toes    herniated disc left lumbar on and off numbness   Shingles 15 yrs ago   Sty, internal 02/15/2017   right eye   Wears glasses     Past Surgical History:  Procedure Laterality Date   ABDOMINAL HYSTERECTOMY  35 yrs ago   partial has right ovary   CYSTOSCOPY WITH RETROGRADE PYELOGRAM, URETEROSCOPY AND STENT PLACEMENT Left 05/03/2013   Procedure: CYSTOSCOPY WITH LEFT RETROGRADE PYELOGRAM, LEFT URETEROSCOPY AND LEFT URETERAL STENT PLACEMENT;  Surgeon: Norleen JINNY Seltzer, MD;  Location: [facility];  Service: Urology;  Laterality: Left;   CYSTOSCOPY WITH RETROGRADE PYELOGRAM, URETEROSCOPY AND STENT PLACEMENT Right 03/31/2017   Procedure: CYSTOSCOPY WITH RIGHT  RETROGRADE PYELOGRAM, URETEROSCOPY HOLMIUM LASER  AND STENT PLACEMENT;  Surgeon: Seltzer Norleen, MD;  Location: [facility];  Service: Urology;  Laterality: Right;   CYSTOSCOPY/URETEROSCOPY/HOLMIUM LASER/STENT PLACEMENT     03/31/17   EXTRACORPOREAL SHOCK WAVE LITHOTRIPSY Right 02/24/2017   Procedure: RIGHT EXTRACORPOREAL SHOCK WAVE LITHOTRIPSY (ESWL);  Surgeon: Ricardo Likens, MD;  Location: [facility];  Service: Urology;  Laterality: Right;   EXTRACORPOREAL SHOCK WAVE LITHOTRIPSY Right 08/13/2024   Procedure: LITHOTRIPSY, ESWL;  Surgeon: Devere Lonni Righter, MD;  Location: [facility];  Service: Urology;  Laterality: Right;  RIGHT EXTRACORPOREAL SHOCKWAVE  LITHOTRISPY   HOLMIUM LASER APPLICATION Left 05/03/2013   Procedure: HOLMIUM LASER APPLICATION;  Surgeon: Norleen JINNY Seltzer, MD;  Location: [facility];  Service: Urology;  Laterality: Left;   HOLMIUM LASER APPLICATION Right 03/31/2017   Procedure: HOLMIUM LASER APPLICATION;  Surgeon: Seltzer Norleen, MD;  Location: [facility];  Service: Urology;  Laterality: Right;   INCISIONAL HERNIA REPAIR N/A 02/19/2021   Procedure: INCISIONAL HERNIA REPAIR WITH MESH;  Surgeon: Vernetta Berg, MD;  Location: [facility];  Service: General;  Laterality: N/A;   LITHOTRIPSY  2012   for kidney stones x 3   LITHOTRIPSY     x 15 times last done 2018   lower Rt parathyroid  gland removed  20012   ROBOT ASSISTED LAPAROSCOPIC NEPHRECTOMY Left 10/09/2018   Procedure: XI ROBOTIC ASSISTED LAPAROSCOPIC PARTIAL NEPHRECTOMY;  Surgeon: Devere Lonni Righter, MD;  Location: [facility];  Service: Urology;  Laterality: Left;   Rt hand trigger finger  2008    Allergies[1]   Physical Exam: General: The patient is alert and oriented x3 in no acute distress.  Dermatology: Skin is warm, dry and supple bilateral lower extremities.   Vascular: Palpable pedal pulses bilaterally. Capillary refill within normal limits.  No appreciable edema.  No erythema.  Neurological: Grossly intact via light touch  Musculoskeletal Exam: Hallux valgus deformity bilateral  Radiographic Exam B/L feet 11/21/2024:  Hallux valgus deformity noted with increased intermetatarsal angle and space between the 1st and 2nd metatarsals.  Assessment/Plan of Care: 1.  Moderate  hallux valgus bilateral  - Patient evaluated.  X-rays reviewed -Today we discussed surgery in detail including the postoperative recovery course.  Unfortunately patient lives alone and would not be able to care for herself after surgery and recovery.  We discussed the risks and benefits associated with surgery as well as possible complications.  No guarantees were expressed or  implied. -For now we will continue to pursue conservative treatment including good supportive tennis shoes that do not irritate or constrict the toebox area -Return to clinic PRN     Thresa EMERSON Sar, DPM [facility]  Dr. Thresa EMERSON Sar, DPM    [address] [place] [address]                Office [phone]  Fax [phone]        [1]  Allergies Allergen Reactions   Penicillins Hives and Rash    Has patient had a PCN reaction causing immediate rash, facial/tongue/throat swelling, SOB or lightheadedness with hypotension: No Has patient had a PCN reaction causing severe rash involving mucus membranes or skin necrosis: No Has patient had a PCN reaction that required hospitalization: No Has patient had a PCN reaction occurring within the last 10 years: No If all of the above answers are NO, then may proceed with Cephalosporin use.    "
